# Patient Record
Sex: Female | Born: 1960 | Race: White | Hispanic: No | Marital: Married | Smoking: Never smoker
Health system: Southern US, Community
[De-identification: ages and names within clinical notes are randomized; demographics above are authoritative.]

## PROBLEM LIST (undated history)

## (undated) DIAGNOSIS — M1990 Unspecified osteoarthritis, unspecified site: Secondary | ICD-10-CM

## (undated) DIAGNOSIS — F32A Depression, unspecified: Secondary | ICD-10-CM

## (undated) DIAGNOSIS — N6002 Solitary cyst of left breast: Secondary | ICD-10-CM

## (undated) DIAGNOSIS — K219 Gastro-esophageal reflux disease without esophagitis: Secondary | ICD-10-CM

## (undated) DIAGNOSIS — E785 Hyperlipidemia, unspecified: Secondary | ICD-10-CM

## (undated) DIAGNOSIS — E119 Type 2 diabetes mellitus without complications: Secondary | ICD-10-CM

## (undated) DIAGNOSIS — F329 Major depressive disorder, single episode, unspecified: Secondary | ICD-10-CM

## (undated) DIAGNOSIS — Z8489 Family history of other specified conditions: Secondary | ICD-10-CM

## (undated) DIAGNOSIS — I10 Essential (primary) hypertension: Secondary | ICD-10-CM

## (undated) HISTORY — PX: ENDOMETRIAL BIOPSY: SHX622

## (undated) HISTORY — DX: Gastro-esophageal reflux disease without esophagitis: K21.9

## (undated) HISTORY — DX: Solitary cyst of left breast: N60.02

## (undated) HISTORY — DX: Essential (primary) hypertension: I10

## (undated) HISTORY — DX: Depression, unspecified: F32.A

## (undated) HISTORY — DX: Hyperlipidemia, unspecified: E78.5

## (undated) HISTORY — DX: Major depressive disorder, single episode, unspecified: F32.9

---
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
## 2006-03-03 ENCOUNTER — Other Ambulatory Visit: Payer: Self-pay

## 2006-03-04 ENCOUNTER — Ambulatory Visit: Payer: Self-pay | Admitting: Unknown Physician Specialty

## 2006-03-04 HISTORY — PX: HYSTEROSCOPY: SHX211

## 2008-09-21 ENCOUNTER — Ambulatory Visit: Payer: Self-pay | Admitting: Gastroenterology

## 2008-09-21 LAB — HM COLONOSCOPY: HM COLON: NORMAL

## 2010-10-02 ENCOUNTER — Ambulatory Visit: Payer: Self-pay | Admitting: Family Medicine

## 2011-02-17 ENCOUNTER — Ambulatory Visit: Payer: Self-pay | Admitting: Orthopedic Surgery

## 2011-08-08 ENCOUNTER — Emergency Department: Payer: Self-pay | Admitting: Emergency Medicine

## 2011-12-23 ENCOUNTER — Ambulatory Visit: Payer: Self-pay | Admitting: Family Medicine

## 2013-06-15 LAB — LIPID PANEL: LDL Cholesterol: 94 mg/dL

## 2014-06-15 LAB — CBC AND DIFFERENTIAL
HCT: 40 % (ref 36–46)
Hemoglobin: 13.9 g/dL (ref 12.0–16.0)
NEUTROS ABS: 71 /uL
Platelets: 251 10*3/uL (ref 150–399)
WBC: 8.6 10^3/mL

## 2014-06-15 LAB — HEPATIC FUNCTION PANEL
ALT: 28 U/L (ref 7–35)
AST: 25 U/L (ref 13–35)
Alkaline Phosphatase: 94 U/L (ref 25–125)
BILIRUBIN, TOTAL: 0.3 mg/dL

## 2014-06-15 LAB — BASIC METABOLIC PANEL
BUN: 16 mg/dL (ref 4–21)
CREATININE: 0.8 mg/dL (ref <=1.1)
GLUCOSE: 147 mg/dL
POTASSIUM: 3.9 mmol/L (ref 3.4–5.3)
SODIUM: 141 mmol/L (ref 137–147)

## 2014-06-15 LAB — LIPID PANEL
Cholesterol: 164 mg/dL (ref 0–200)
HDL: 36 mg/dL (ref 35–70)
LDl/HDL Ratio: 2.6
TRIGLYCERIDES: 171 mg/dL — AB (ref 40–160)

## 2014-06-15 LAB — HEMOGLOBIN A1C: HEMOGLOBIN A1C: 8 % — AB (ref 4.0–6.0)

## 2014-06-15 LAB — TSH: TSH: 2.66 u[IU]/mL (ref <=5.90)

## 2014-10-14 DIAGNOSIS — K219 Gastro-esophageal reflux disease without esophagitis: Secondary | ICD-10-CM | POA: Insufficient documentation

## 2014-10-14 DIAGNOSIS — H5702 Anisocoria: Secondary | ICD-10-CM | POA: Insufficient documentation

## 2014-10-14 DIAGNOSIS — I10 Essential (primary) hypertension: Secondary | ICD-10-CM | POA: Insufficient documentation

## 2014-10-14 DIAGNOSIS — Z8673 Personal history of transient ischemic attack (TIA), and cerebral infarction without residual deficits: Secondary | ICD-10-CM | POA: Insufficient documentation

## 2014-10-14 DIAGNOSIS — E1169 Type 2 diabetes mellitus with other specified complication: Secondary | ICD-10-CM | POA: Insufficient documentation

## 2014-10-14 DIAGNOSIS — F4320 Adjustment disorder, unspecified: Secondary | ICD-10-CM | POA: Insufficient documentation

## 2014-10-14 DIAGNOSIS — I152 Hypertension secondary to endocrine disorders: Secondary | ICD-10-CM | POA: Insufficient documentation

## 2014-10-14 DIAGNOSIS — E119 Type 2 diabetes mellitus without complications: Secondary | ICD-10-CM | POA: Insufficient documentation

## 2014-10-14 DIAGNOSIS — F320 Major depressive disorder, single episode, mild: Secondary | ICD-10-CM | POA: Insufficient documentation

## 2014-10-14 DIAGNOSIS — E7800 Pure hypercholesterolemia, unspecified: Secondary | ICD-10-CM | POA: Insufficient documentation

## 2014-11-02 ENCOUNTER — Ambulatory Visit (INDEPENDENT_AMBULATORY_CARE_PROVIDER_SITE_OTHER): Payer: 59 | Admitting: Family Medicine

## 2014-11-02 ENCOUNTER — Encounter: Payer: Self-pay | Admitting: Family Medicine

## 2014-11-02 VITALS — BP 100/72 | HR 74 | Temp 98.4°F | Resp 12 | Wt 157.0 lb

## 2014-11-02 DIAGNOSIS — E7800 Pure hypercholesterolemia, unspecified: Secondary | ICD-10-CM

## 2014-11-02 DIAGNOSIS — J3089 Other allergic rhinitis: Secondary | ICD-10-CM | POA: Diagnosis not present

## 2014-11-02 DIAGNOSIS — F320 Major depressive disorder, single episode, mild: Secondary | ICD-10-CM

## 2014-11-02 DIAGNOSIS — I10 Essential (primary) hypertension: Secondary | ICD-10-CM

## 2014-11-02 DIAGNOSIS — E119 Type 2 diabetes mellitus without complications: Secondary | ICD-10-CM | POA: Diagnosis not present

## 2014-11-02 LAB — POCT GLYCOSYLATED HEMOGLOBIN (HGB A1C): Hemoglobin A1C: 7.7

## 2014-11-02 LAB — HM DIABETES FOOT EXAM: HM Diabetic Foot Exam: NORMAL

## 2014-11-02 MED ORDER — ATORVASTATIN CALCIUM 40 MG PO TABS: 40.0000 mg | ORAL_TABLET | Freq: Every day | ORAL | Status: DC

## 2014-11-02 MED ORDER — FLUTICASONE PROPIONATE 50 MCG/ACT NA SUSP: 2.0000 | Freq: Every day | NASAL | Status: DC

## 2014-11-02 MED ORDER — HYDROCHLOROTHIAZIDE 25 MG PO TABS: 25.0000 mg | ORAL_TABLET | Freq: Every day | ORAL | Status: DC

## 2014-11-02 MED ORDER — METFORMIN HCL 1000 MG PO TABS: 1000.0000 mg | ORAL_TABLET | Freq: Every day | ORAL | Status: DC

## 2014-11-02 MED ORDER — SERTRALINE HCL 100 MG PO TABS: 100.0000 mg | ORAL_TABLET | Freq: Every day | ORAL | Status: DC

## 2014-11-02 NOTE — Progress Notes (Signed)
Patient ID: Andrea Soto, female   DOB: 11-Jan-1961, 54 y.o.   MRN: [id number]   Patient: Andrea Soto Female    DOB: August 21, 1960   54 y.o.   MRN: [id number] Visit Date: 11/02/2014  Today's Provider: [name], MD   Chief Complaint  Patient presents with  . Diabetes  . Depression  . Hyperlipidemia   Subjective:    HPI   Overall she feels well and has no complaints.   Diabetes Mellitus Type II, Follow-up:   Lab Results  Component Value Date   HGBA1C 7.7 11/02/2014   HGBA1C 8.0* 06/15/2014    Last seen for diabetes 4 months ago.  Management changes included none. She reports good compliance with treatment. She is not having side effects.  Current symptoms include none and have been unchanged. Home blood sugar records: she is not checking sugar levels at home  Episodes of hypoglycemia? no   Current Insulin Regimen: none Most Recent Eye Exam: 05/2014. Weight trend: stable Prior visit with dietician: yes - with her husband 2 years ago per patient. Current diet: on average, 3 meals per day Current exercise: none  Pertinent Labs:    Component Value Date/Time   CHOL 164 06/15/2014   TRIG 171* 06/15/2014   CREATININE 0.8 06/15/2014    Wt Readings from Last 3 Encounters:  11/02/14 157 lb (71.215 kg)  05/24/14 155 lb (70.308 kg)    ------------------------------------------------------------------------  Depression, Follow-up  She  was last seen for this 4 months ago. Changes made at last visit include none.   She reports good compliance with treatment. She is not having side effects.   She reportsgood compliance with treatment. She reports good tolerance of treatment. Current symptoms include: doing well on Sertraline. She feels she is Unchanged since last visit.  ------------------------------------------------------------------------   Lipid/Cholesterol, Follow-up:   Last seen for this4 months ago.  Management changes since that visit include  ordered levels. . Last Lipid Panel:    Component Value Date/Time   CHOL 164 06/15/2014   TRIG 171* 06/15/2014   HDL 36 06/15/2014   LDLCALC 94 06/15/2013    She reports good compliance with treatment. She is not having side effects.  Current symptoms include none and have been resolved. Weight trend: stable Prior visit with dietician: yes - 2 years ago Current diet: on average, 3 meals per day Current exercise: none  Wt Readings from Last 3 Encounters:  11/02/14 157 lb (71.215 kg)  05/24/14 155 lb (70.308 kg)    -------------------------------------------------------------------       Previous Medications   ASPIRIN 81 MG TABLET    Take by mouth.   ATORVASTATIN (LIPITOR) 40 MG TABLET    Take by mouth.   COENZYME Q10 (COQ10) 200 MG CAPS    Take by mouth.   HYDROCHLOROTHIAZIDE (HYDRODIURIL) 25 MG TABLET    Take by mouth.   MELOXICAM (MOBIC) 15 MG TABLET    Take by mouth.   METFORMIN (GLUCOPHAGE) 1000 MG TABLET    Take by mouth.   OMEPRAZOLE (PRILOSEC OTC) 20 MG TABLET    Take by mouth.   SERTRALINE (ZOLOFT) 100 MG TABLET    Take by mouth.    Review of Systems  Constitutional: Positive for fatigue.  HENT: Positive for congestion (in right ear- x 1 week ago).   Respiratory: Negative.   Cardiovascular: Negative.   Musculoskeletal: Negative.     History  Substance Use Topics  . Smoking status: Never Smoker   . Smokeless tobacco: Never  Used  . Alcohol Use: 0.6 oz/week    1 Glasses of wine per week   Objective:   BP 100/72 mmHg  Pulse 74  Temp(Src) 98.4 F (36.9 C)  Resp 12  Wt 157 lb (71.215 kg)  Physical Exam  Constitutional: She appears well-developed and well-nourished.  HENT:  Head: Normocephalic.  Right Ear: External ear normal. No lacerations. No swelling or tenderness.  Left Ear: External ear normal.  Nose: Nose normal.  Mouth/Throat: Oropharynx is clear and moist.  Eyes: Conjunctivae are normal. Pupils are equal, round, and reactive to light.   Neck: Normal range of motion.  Cardiovascular: Normal rate, regular rhythm, normal heart sounds and intact distal pulses.   No murmur heard. Pulmonary/Chest: Effort normal and breath sounds normal. She has no wheezes. She has no rales.  Musculoskeletal: Normal range of motion.  Neurological: She is alert. No sensory deficit.  Normal monofilament exam today 11/02/14  Skin: Skin is warm.  Psychiatric: She has a normal mood and affect. Her behavior is normal.         1. Essential hypertension Stable.  2. Type 2 diabetes mellitus without complication L5Q today 7.7. Better. Continue working on habits. Follow for now.  - POCT HgB A1C - HM Diabetes Foot Exam-normal today.  3. Depression, major, single episode, mild Stable. Follow.  4. Other allergic rhinitis Try Flonase. Follow as needed. - fluticasone (FLONASE) 50 MCG/ACT nasal spray; Place 2 sprays into both nostrils daily.  Dispense: 16 g; Refill: 6  5. Hypercholesteremia Stable on last visit.   Follow up 4 month.  Patient was seen and examined by Dr. Eulas Post and note was scribed by Theressa Millard, RMA.  I have done the exam and reviewed the above chart and it is accurate to the best of my knowledge.

## 2014-11-13 ENCOUNTER — Emergency Department: Payer: 59

## 2014-11-13 ENCOUNTER — Emergency Department
Admission: EM | Admit: 2014-11-13 | Discharge: 2014-11-13 | Disposition: A | Discharge status: Home / Self Care | Payer: 59 | Attending: Emergency Medicine | Admitting: Emergency Medicine

## 2014-11-13 ENCOUNTER — Encounter: Payer: Self-pay | Admitting: Family Medicine

## 2014-11-13 ENCOUNTER — Ambulatory Visit (INDEPENDENT_AMBULATORY_CARE_PROVIDER_SITE_OTHER): Payer: 59 | Admitting: Family Medicine

## 2014-11-13 ENCOUNTER — Encounter: Payer: Self-pay | Admitting: Emergency Medicine

## 2014-11-13 VITALS — BP 136/90 | HR 103 | Temp 98.2°F | Resp 18 | Wt 156.0 lb

## 2014-11-13 DIAGNOSIS — W57XXXA Bitten or stung by nonvenomous insect and other nonvenomous arthropods, initial encounter: Secondary | ICD-10-CM

## 2014-11-13 DIAGNOSIS — R52 Pain, unspecified: Secondary | ICD-10-CM | POA: Insufficient documentation

## 2014-11-13 DIAGNOSIS — K529 Noninfective gastroenteritis and colitis, unspecified: Secondary | ICD-10-CM | POA: Diagnosis not present

## 2014-11-13 DIAGNOSIS — E860 Dehydration: Secondary | ICD-10-CM

## 2014-11-13 DIAGNOSIS — R197 Diarrhea, unspecified: Secondary | ICD-10-CM | POA: Insufficient documentation

## 2014-11-13 DIAGNOSIS — Z7952 Long term (current) use of systemic steroids: Secondary | ICD-10-CM | POA: Diagnosis not present

## 2014-11-13 DIAGNOSIS — E119 Type 2 diabetes mellitus without complications: Secondary | ICD-10-CM | POA: Diagnosis not present

## 2014-11-13 DIAGNOSIS — Z7982 Long term (current) use of aspirin: Secondary | ICD-10-CM | POA: Diagnosis not present

## 2014-11-13 DIAGNOSIS — S90862A Insect bite (nonvenomous), left foot, initial encounter: Secondary | ICD-10-CM | POA: Diagnosis not present

## 2014-11-13 DIAGNOSIS — Z791 Long term (current) use of non-steroidal anti-inflammatories (NSAID): Secondary | ICD-10-CM | POA: Diagnosis not present

## 2014-11-13 DIAGNOSIS — R112 Nausea with vomiting, unspecified: Secondary | ICD-10-CM | POA: Insufficient documentation

## 2014-11-13 DIAGNOSIS — Z792 Long term (current) use of antibiotics: Secondary | ICD-10-CM | POA: Insufficient documentation

## 2014-11-13 DIAGNOSIS — Z79899 Other long term (current) drug therapy: Secondary | ICD-10-CM | POA: Diagnosis not present

## 2014-11-13 DIAGNOSIS — N3090 Cystitis, unspecified without hematuria: Secondary | ICD-10-CM

## 2014-11-13 LAB — CBC WITH DIFFERENTIAL/PLATELET
BASOS PCT: 0 %
Basophils Absolute: 0 10*3/uL (ref 0–0.1)
Eosinophils Absolute: 0 10*3/uL (ref 0–0.7)
Eosinophils Relative: 0 %
HEMATOCRIT: 41.6 % (ref 35.0–47.0)
Hemoglobin: 13.8 g/dL (ref 12.0–16.0)
Lymphocytes Relative: 10 %
Lymphs Abs: 0.7 10*3/uL — ABNORMAL LOW (ref 1.0–3.6)
MCH: 27.5 pg (ref 26.0–34.0)
MCHC: 33.2 g/dL (ref 32.0–36.0)
MCV: 82.9 fL (ref 80.0–100.0)
Monocytes Absolute: 0.6 10*3/uL (ref 0.2–0.9)
Monocytes Relative: 8 %
Neutro Abs: 5.9 10*3/uL (ref 1.4–6.5)
Neutrophils Relative %: 82 %
Platelets: 188 10*3/uL (ref 150–440)
RBC: 5.02 MIL/uL (ref 3.80–5.20)
RDW: 13.7 % (ref 11.5–14.5)
WBC: 7.2 10*3/uL (ref 3.6–11.0)

## 2014-11-13 LAB — BASIC METABOLIC PANEL
Anion gap: 8 (ref 5–15)
BUN: 17 mg/dL (ref 6–20)
CO2: 27 mmol/L (ref 22–32)
Calcium: 8.2 mg/dL — ABNORMAL LOW (ref 8.9–10.3)
Chloride: 101 mmol/L (ref 101–111)
Creatinine, Ser: 0.64 mg/dL (ref 0.44–1.00)
GFR calc Af Amer: >60 mL/min (ref >60)
Glucose, Bld: 175 mg/dL — ABNORMAL HIGH (ref 65–99)
POTASSIUM: 3 mmol/L — AB (ref 3.5–5.1)
Sodium: 136 mmol/L (ref 135–145)

## 2014-11-13 LAB — HEPATIC FUNCTION PANEL
ALT: 33 U/L (ref 14–54)
AST: 27 U/L (ref 15–41)
Albumin: 3.9 g/dL (ref 3.5–5.0)
Alkaline Phosphatase: 77 U/L (ref 38–126)
BILIRUBIN INDIRECT: 0.5 mg/dL (ref 0.3–0.9)
Bilirubin, Direct: 0.1 mg/dL (ref 0.1–0.5)
TOTAL PROTEIN: 6.9 g/dL (ref 6.5–8.1)
Total Bilirubin: 0.6 mg/dL (ref 0.3–1.2)

## 2014-11-13 LAB — URINALYSIS COMPLETE WITH MICROSCOPIC (ARMC ONLY)
Bilirubin Urine: NEGATIVE
Glucose, UA: NEGATIVE mg/dL
Hgb urine dipstick: NEGATIVE
KETONES UR: NEGATIVE mg/dL
Nitrite: POSITIVE — AB
PH: 5 (ref 5.0–8.0)
PROTEIN: 30 mg/dL — AB
SPECIFIC GRAVITY, URINE: 1.026 (ref 1.005–1.030)

## 2014-11-13 LAB — LIPASE, BLOOD: LIPASE: 24 U/L (ref 22–51)

## 2014-11-13 LAB — CK: Total CK: 65 U/L (ref 38–234)

## 2014-11-13 LAB — SEDIMENTATION RATE: Sed Rate: 17 mm/hr (ref 0–30)

## 2014-11-13 MED ORDER — MORPHINE SULFATE 4 MG/ML IJ SOLN
INTRAMUSCULAR | Status: AC
  Administered 2014-11-13: 4 mg via INTRAVENOUS
  Filled 2014-11-13: qty 1

## 2014-11-13 MED ORDER — MORPHINE SULFATE 4 MG/ML IJ SOLN
4.0000 mg | Freq: Once | INTRAMUSCULAR | Status: AC
  Administered 2014-11-13: 4 mg via INTRAVENOUS

## 2014-11-13 MED ORDER — CEFTRIAXONE SODIUM IN DEXTROSE 20 MG/ML IV SOLN
INTRAVENOUS | Status: AC
  Administered 2014-11-13: 1000 mg via INTRAVENOUS
  Filled 2014-11-13: qty 50

## 2014-11-13 MED ORDER — DOXYCYCLINE HYCLATE 100 MG IV SOLR
100.0000 mg | Freq: Once | INTRAVENOUS | Status: AC
  Administered 2014-11-13: 100 mg via INTRAVENOUS
  Filled 2014-11-13: qty 100

## 2014-11-13 MED ORDER — ONDANSETRON HCL 4 MG/2ML IJ SOLN
INTRAMUSCULAR | Status: AC
  Administered 2014-11-13: 4 mg via INTRAVENOUS
  Filled 2014-11-13: qty 2

## 2014-11-13 MED ORDER — CEFTRIAXONE SODIUM IN DEXTROSE 20 MG/ML IV SOLN
1.0000 g | Freq: Once | INTRAVENOUS | Status: AC
  Administered 2014-11-13: 1000 mg via INTRAVENOUS

## 2014-11-13 MED ORDER — KETOROLAC TROMETHAMINE 30 MG/ML IJ SOLN
INTRAMUSCULAR | Status: AC
  Administered 2014-11-13: 30 mg via INTRAVENOUS
  Filled 2014-11-13: qty 1

## 2014-11-13 MED ORDER — KETOROLAC TROMETHAMINE 30 MG/ML IJ SOLN
30.0000 mg | Freq: Once | INTRAMUSCULAR | Status: AC
  Administered 2014-11-13: 30 mg via INTRAVENOUS

## 2014-11-13 MED ORDER — ONDANSETRON HCL 4 MG PO TABS: 4.0000 mg | ORAL_TABLET | Freq: Four times a day (QID) | ORAL | Status: DC | PRN

## 2014-11-13 MED ORDER — DOXYCYCLINE HYCLATE 100 MG PO CAPS
100.0000 mg | ORAL_CAPSULE | Freq: Two times a day (BID) | ORAL | Status: DC
Start: 2014-11-13 — End: 2014-12-14

## 2014-11-13 MED ORDER — SULFAMETHOXAZOLE-TRIMETHOPRIM 800-160 MG PO TABS: 1.0000 | ORAL_TABLET | Freq: Two times a day (BID) | ORAL | Status: DC

## 2014-11-13 MED ORDER — POTASSIUM CHLORIDE 20 MEQ PO PACK
PACK | ORAL | Status: AC
  Administered 2014-11-13: 40 meq via ORAL
  Filled 2014-11-13: qty 2

## 2014-11-13 MED ORDER — ONDANSETRON HCL 4 MG/2ML IJ SOLN
4.0000 mg | Freq: Once | INTRAMUSCULAR | Status: AC
  Administered 2014-11-13: 4 mg via INTRAVENOUS

## 2014-11-13 MED ORDER — SODIUM CHLORIDE 0.9 % IV BOLUS (SEPSIS)
1000.0000 mL | Freq: Once | INTRAVENOUS | Status: AC
  Administered 2014-11-13: 1000 mL via INTRAVENOUS

## 2014-11-13 MED ORDER — SODIUM CHLORIDE 0.9 % IV BOLUS (SEPSIS): 1000.0000 mL | Freq: Once | INTRAVENOUS | Status: AC

## 2014-11-13 MED ORDER — POTASSIUM CHLORIDE 20 MEQ PO PACK
40.0000 meq | PACK | Freq: Once | ORAL | Status: AC
  Administered 2014-11-13: 40 meq via ORAL

## 2014-11-13 NOTE — ED Notes (Signed)
Pt up to bedside commode.  Unable to obtain sample.

## 2014-11-13 NOTE — ED Notes (Signed)
Reports pulling a tick off about 8 days ago.  3 days ago started having fever, n/v/d. Skin w/d at this time.  Moist mm.

## 2014-11-13 NOTE — ED Notes (Signed)
Pt resting, receiving IV fluids and antibiotic.

## 2014-11-13 NOTE — Discharge Instructions (Signed)
Dehydration, Adult Dehydration is when you lose more fluids from the body than you take in. Vital organs like the kidneys, brain, and heart cannot function without a proper amount of fluids and salt. Any loss of fluids from the body can cause dehydration.  CAUSES   Vomiting.  Diarrhea.  Excessive sweating.  Excessive urine output.  Fever. SYMPTOMS  Mild dehydration  Thirst.  Dry lips.  Slightly dry mouth. Moderate dehydration  Very dry mouth.  Sunken eyes.  Skin does not bounce back quickly when lightly pinched and released.  Dark urine and decreased urine production.  Decreased tear production.  Headache. Severe dehydration  Very dry mouth.  Extreme thirst.  Rapid, weak pulse (more than 100 beats per minute at rest).  Cold hands and feet.  Not able to sweat in spite of heat and temperature.  Rapid breathing.  Blue lips.  Confusion and lethargy.  Difficulty being awakened.  Minimal urine production.  No tears. DIAGNOSIS  Your caregiver will diagnose dehydration based on your symptoms and your exam. Blood and urine tests will help confirm the diagnosis. The diagnostic evaluation should also identify the cause of dehydration. TREATMENT  Treatment of mild or moderate dehydration can often be done at home by increasing the amount of fluids that you drink. It is best to drink small amounts of fluid more often. Drinking too much at one time can make vomiting worse. Refer to the home care instructions below. Severe dehydration needs to be treated at the hospital where you will probably be given intravenous (IV) fluids that contain water and electrolytes. HOME CARE INSTRUCTIONS   Ask your caregiver about specific rehydration instructions.  Drink enough fluids to keep your urine clear or pale yellow.  Drink small amounts frequently if you have nausea and vomiting.  Eat as you normally do.  Avoid:  Foods or drinks high in sugar.  Carbonated  drinks.  Juice.  Extremely hot or cold fluids.  Drinks with caffeine.  Fatty, greasy foods.  Alcohol.  Tobacco.  Overeating.  Gelatin desserts.  Wash your hands well to avoid spreading bacteria and viruses.  Only take over-the-counter or prescription medicines for pain, discomfort, or fever as directed by your caregiver.  Ask your caregiver if you should continue all prescribed and over-the-counter medicines.  Keep all follow-up appointments with your caregiver. SEEK MEDICAL CARE IF:  You have abdominal pain and it increases or stays in one area (localizes).  You have a rash, stiff neck, or severe headache.  You are irritable, sleepy, or difficult to awaken.  You are weak, dizzy, or extremely thirsty. SEEK IMMEDIATE MEDICAL CARE IF:   You are unable to keep fluids down or you get worse despite treatment.  You have frequent episodes of vomiting or diarrhea.  You have blood or green matter (bile) in your vomit.  You have blood in your stool or your stool looks black and tarry.  You have not urinated in 6 to 8 hours, or you have only urinated a small amount of very dark urine.  You have a fever.  You faint. MAKE SURE YOU:   Understand these instructions.  Will watch your condition.  Will get help right away if you are not doing well or get worse. Document Released: 05/12/2005 Document Revised: 08/04/2011 Document Reviewed: 12/30/2010 Exit[employer] Patient Information 2015 [employer]. This information is not intended to replace advice given to you by your health care provider. Make sure you discuss any questions you have with your health care   provider.  

## 2014-11-13 NOTE — ED Notes (Signed)
Pt was at Dr. Jabier Gauss office with c/o N/V/D for the last three days.

## 2014-11-13 NOTE — ED Provider Notes (Signed)
[facility] Emergency Department Provider Note  ____________________________________________  Time seen: 12:10 PM  I have reviewed the triage vital signs and the nursing notes.   HISTORY  Chief Complaint Dehydration    HPI Andrea Soto is a 54 y.o. female who complains of nausea vomiting diarrhea and generalized body aches for the past 3 days. She found a tick on her left foot 8 days ago. She is unsure how long it was on there but thinks it was less than a day. 3 days ago, she started having the nausea vomiting diarrhea, generalized body aches and joint pains, and subjective fever. Temperature was 99 at home. She has not noted any rashes on her skin. 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 No pertinent past medical history.  Patient Active Problem List   Diagnosis Date Noted  . 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 Depression, major, single episode, mild 10/14/2014  . Acid reflux 10/14/2014  . H/O transient cerebral ischemia 10/14/2014  . Hypercholesteremia 10/14/2014  . BP (high blood pressure) 10/14/2014  . 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 Diabetes mellitus, type 2 10/14/2014    Past Surgical History  Procedure Laterality Date  . Endometrial biopsy    . Breast biopsy  1978    Current Outpatient Rx  Name  Route  Sig  Dispense  Refill  . aspirin 81 MG tablet   Oral   Take by mouth.         Marland Kitchen atorvastatin (LIPITOR) 40 MG tablet   Oral   Take 1 tablet (40 mg total) by mouth at bedtime.   90 tablet   3   . Coenzyme Q10 (COQ10) 200 MG CAPS   Oral   Take by mouth.         . doxycycline (VIBRAMYCIN) 100 MG capsule   Oral   Take 1 capsule (100 mg total) by mouth 2 (two) times daily.   28 capsule   0   . fluticasone (FLONASE) 50 MCG/ACT nasal spray   Each Nare   Place 2 sprays into both nostrils daily.   16 g   6   . hydrochlorothiazide  (HYDRODIURIL) 25 MG tablet   Oral   Take 1 tablet (25 mg total) by mouth daily.   90 tablet   3   . meloxicam (MOBIC) 15 MG tablet   Oral   Take by mouth.         . metFORMIN (GLUCOPHAGE) 1000 MG tablet   Oral   Take 1 tablet (1,000 mg total) by mouth daily with breakfast.   90 tablet   3   . omeprazole (PRILOSEC OTC) 20 MG tablet   Oral   Take by mouth.         . ondansetron (ZOFRAN) 4 MG tablet   Oral   Take 1 tablet (4 mg total) by mouth every 6 (six) hours as needed for nausea or vomiting.   20 tablet   1   . sertraline (ZOLOFT) 100 MG tablet   Oral   Take 1 tablet (100 mg total) by mouth daily.   90 tablet   3     Allergies Review of patient's allergies indicates no known allergies.  Family History  Problem Relation Age of Onset  . Hypertension Mother   . Allergic rhinitis Mother   . Cancer Father     multiple myeloma and  bladder cancer.  Marland Kitchen Heart attack Father   . Multiple myeloma Father   . Hypertension Sister   . Epilepsy Daughter   . Seizures Daughter   . Allergic rhinitis Son   . Breast cancer Paternal Grandmother   . Bone cancer Paternal Grandmother     Social History History  Substance Use Topics  . Smoking status: Never Smoker   . Smokeless tobacco: Never Used  . Alcohol Use: 0.6 oz/week    1 Glasses of wine per week    Review of Systems  Constitutional: Positive fever and chills. No weight changes. Profound fatigue Eyes:No blurry vision or double vision.  ENT: No sore throat. Cardiovascular: No chest pain. Respiratory: No dyspnea or cough. Gastrointestinal: Generalized abdominal pain with vomiting and diarrhea.  No BRBPR or melena. Genitourinary: Negative for dysuria, urinary retention, bloody urine, or difficulty urinating. Musculoskeletal: Generalized muscle and joint pain. Skin: Negative for rash. Neurological: Negative for headaches, focal weakness or numbness. Psychiatric:No anxiety or depression.   Endocrine:No  hot/cold intolerance, changes in energy, or sleep difficulty.  10-point ROS otherwise negative.  ____________________________________________   PHYSICAL EXAM:  VITAL SIGNS: ED Triage Vitals  Enc Vitals Group     BP 11/13/14 1206 123/79 mmHg     Pulse Rate 11/13/14 1206 105     Resp 11/13/14 1206 20     Temp 11/13/14 1206 99.3 F (37.4 C)     Temp Source 11/13/14 1206 Oral     SpO2 11/13/14 1206 96 %     Weight 11/13/14 1206 155 lb (70.308 kg)     Height 11/13/14 1206 '5\' 2"'  (1.575 m)     Head Cir --      Peak Flow --      Pain Score --      Pain Loc --      Pain Edu? --      Excl. in [place]? --      Constitutional: Alert and oriented. Generally ill appearing. Eyes: No scleral icterus. No conjunctival pallor. PERRL. EOMI ENT   Head: Normocephalic and atraumatic.   Nose: No congestion/rhinnorhea. No septal hematoma   Mouth/Throat: Dry mucous membranes, no pharyngeal erythema. No peritonsillar mass. No uvula shift.   Neck: No stridor. No SubQ emphysema. No meningismus. Hematological/Lymphatic/Immunilogical: No cervical lymphadenopathy. Cardiovascular: Tachycardia to 105. Normal and symmetric distal pulses are present in all extremities. No murmurs, rubs, or gallops. Respiratory: Normal respiratory effort without tachypnea nor retractions. Breath sounds are clear and equal bilaterally. No wheezes/rales/rhonchi. Gastrointestinal: Generalized tenderness, nonfocal, soft. No distention. There is no CVA tenderness.  No rebound, rigidity, or guarding. Genitourinary: deferred Musculoskeletal: Nontender with normal range of motion in all extremities. No joint effusions.  No lower extremity tenderness.  No edema. Neurologic:   Normal speech and language.  CN 2-10 normal. Motor grossly intact. No pronator drift.  Normal gait. No gross focal neurologic deficits are appreciated.  Skin:  Skin is warm, dry and intact. No rash noted.  No petechiae, purpura, or bullae. 1-2 mm  umbilicated lesion on the left medial foot where the tick was removed. There does not appear to be any retained tick fragment. There is no target lesion or pattern to rash on the leg or anywhere else on the body Psychiatric: Mood and affect are normal. Speech and behavior are normal. Patient exhibits appropriate insight and judgment.  ____________________________________________    LABS (pertinent positives/negatives) (all labs ordered are listed, but only abnormal results are displayed) Labs Reviewed  BASIC METABOLIC PANEL -  Abnormal; Notable for the following:    Potassium 3.0 (*)    Glucose, Bld 175 (*)    Calcium 8.2 (*)    All other components within normal limits  CBC WITH DIFFERENTIAL/PLATELET - Abnormal; Notable for the following:    Lymphs Abs 0.7 (*)    All other components within normal limits  HEPATIC FUNCTION PANEL  LIPASE, BLOOD  CK  SEDIMENTATION RATE  URINALYSIS COMPLETEWITH MICROSCOPIC ([facility] ONLY)   ____________________________________________   EKG    ____________________________________________    RADIOLOGY  Chest x-ray unremarkable  ____________________________________________   PROCEDURES  ____________________________________________   INITIAL IMPRESSION / ASSESSMENT AND PLAN / ED COURSE  Pertinent labs & imaging results that were available during my care of the patient were reviewed by me and considered in my medical decision making (see chart for details).  Patient presents with mild elevation of temperature and heart rate in the setting of a tick exposure. This is possibly consistent with a tick borne illness especially since she has contracted such an illness in her area in the past. We'll give her IV fluids, symptom relief with Toradol and Zofran, and start doxycycline pending workup. ----------------------------------------- 3:16 PM on 11/13/2014 -----------------------------------------  Labs unremarkable except for mild hypokalemia  at 3.0. Patient will be given oral potassium repletion. She is feeling much better after 1 L IV fluids and her vital signs have normalized. We will proceed with oral challenge, continue rehydrating until she is able to produce urine. We'll make sure she doesn't have a urinary tract infection, and plan on discharging her home on doxycycline for tick exposure. Care of the patient is signed out to the oncoming physician Dr. Jimmye Norman will follow up on response to IV fluids and urinalysis. ____________________________________________   FINAL CLINICAL IMPRESSION(S) / ED DIAGNOSES  Final diagnoses:  Nausea vomiting and diarrhea  Dehydration      Carrie Mew, MD 11/13/14 1517

## 2014-11-13 NOTE — Progress Notes (Signed)
Subjective:    Patient ID: Andrea Soto, female    DOB: Feb 06, 1961, 54 y.o.   MRN: [id number]  HPI Diarrhea and Emesis with body aches since Saturday 11-11-14 in the afternoon. Started with diarrhea and stomach cramps before nausea and vomiting started. Having watery stools every 2-3 hours now. Last episode of vomiting was Sunday morning 11-12-14. Ate some dry toast without vomiting in the afternoon. 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 Had a tick bite to the left foot 8-10 days ago.   Patient Active Problem List   Diagnosis Date Noted  . 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 Depression, major, single episode, mild 10/14/2014  . Acid reflux 10/14/2014  . H/O transient cerebral ischemia 10/14/2014  . Hypercholesteremia 10/14/2014  . BP (high blood pressure) 10/14/2014  . 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 Diabetes mellitus, type 2 10/14/2014   Past Surgical History  Procedure Laterality Date  . Endometrial biopsy    . Breast biopsy  1978   History  Substance Use Topics  . Smoking status: Never Smoker   . Smokeless tobacco: Never Used  . Alcohol Use: 0.6 oz/week    1 Glasses of wine per week   Family History  Problem Relation Age of Onset  . Hypertension Mother   . Allergic rhinitis Mother   . Cancer Father     multiple myeloma and bladder cancer.  Marland Kitchen Heart attack Father   . Multiple myeloma Father   . Hypertension Sister   . Epilepsy Daughter   . Seizures Daughter   . Allergic rhinitis Son   . Breast cancer Paternal Grandmother   . Bone cancer Paternal Grandmother    Current Outpatient Prescriptions on File Prior to Visit  Medication Sig Dispense Refill  . aspirin 81 MG tablet Take by mouth.    Marland Kitchen atorvastatin (LIPITOR) 40 MG tablet Take 1 tablet (40 mg total) by mouth at bedtime. 90 tablet 3  . Coenzyme Q10 (COQ10) 200 MG CAPS Take by mouth.    . fluticasone (FLONASE) 50 MCG/ACT nasal spray Place 2 sprays into both  nostrils daily. 16 g 6  . hydrochlorothiazide (HYDRODIURIL) 25 MG tablet Take 1 tablet (25 mg total) by mouth daily. 90 tablet 3  . meloxicam (MOBIC) 15 MG tablet Take by mouth.    . metFORMIN (GLUCOPHAGE) 1000 MG tablet Take 1 tablet (1,000 mg total) by mouth daily with breakfast. 90 tablet 3  . omeprazole (PRILOSEC OTC) 20 MG tablet Take by mouth.    . sertraline (ZOLOFT) 100 MG tablet Take 1 tablet (100 mg total) by mouth daily. 90 tablet 3   No current facility-administered medications on file prior to visit.   No Known Allergies  Review of Systems  Constitutional: Positive for appetite change and fatigue. Negative for fever and chills.  Respiratory: Negative.   Cardiovascular: Negative.   Gastrointestinal: Positive for nausea, vomiting, abdominal pain and diarrhea.  Musculoskeletal:       Body aches and soreness in muscles.  Neurological: Positive for dizziness and headaches.      BP 136/90 mmHg  Pulse 103  Temp(Src) 98.2 F (36.8 C) (Oral)  Resp 18  Wt 156 lb (70.761 kg)  SpO2 93%  Objective:   Physical Exam  Constitutional: She is oriented to person, place, and time. She appears well-developed and well-nourished. She appears distressed.  Pale, lethargic, dizzy and weak.  HENT:  Head: Normocephalic and atraumatic.  Right Ear: External ear normal.  Left Ear: External ear normal.  Nose: Nose normal.  Eyes: EOM are normal. Pupils are equal, round, and reactive to light.  Neck: Normal range of motion. Neck supple.  Cardiovascular: Regular rhythm and normal heart sounds.  Tachycardia present.   Abdominal: There is no hepatosplenomegaly. There is generalized tenderness. There is no rigidity and no rebound.    Bowel sounds slightly hyperactive.  Lymphadenopathy:       Head (right side): No submandibular adenopathy present.       Head (left side): No submandibular adenopathy present.       Right axillary: No lateral adenopathy present.       Left axillary: No lateral  adenopathy present.      Right: No inguinal adenopathy present.       Left: No inguinal adenopathy present.  No enlargement of nodes.  Neurological: She is alert and oriented to person, place, and time.  Skin: There is pallor.      Assessment & Plan:  1. Gastroenteritis Onset of diarrhea, nausea and vomiting the past 2 days. Watery diarrhea and unable to retain food or much liquids. Generalized abdominal cramping/tenderness. Sent to Er for IV rehydration. Unable to keep medications down.  2. Tick bite of foot, left, initial encounter New bite to the left foot 8-10 days ago. History of Ehrlichiosis infection in 2013. States the general malaise and weakness with sore muscles feels the same this time.  3. Dehydration Dizzy and weak with lethargy. Pale and not able to keep much down without vomiting or watery diarrhea ever 2-3 hours. No help with OTC Imodium-AD. Sent to ER for IV hydration.

## 2014-11-14 ENCOUNTER — Encounter: Payer: Self-pay | Admitting: Emergency Medicine

## 2014-11-14 ENCOUNTER — Emergency Department
Admission: EM | Admit: 2014-11-14 | Discharge: 2014-11-14 | Disposition: A | Discharge status: Home / Self Care | Payer: 59 | Attending: Emergency Medicine | Admitting: Emergency Medicine

## 2014-11-14 DIAGNOSIS — E792 Myoadenylate deaminase deficiency: Secondary | ICD-10-CM | POA: Diagnosis not present

## 2014-11-14 DIAGNOSIS — Z791 Long term (current) use of non-steroidal anti-inflammatories (NSAID): Secondary | ICD-10-CM | POA: Insufficient documentation

## 2014-11-14 DIAGNOSIS — Z7951 Long term (current) use of inhaled steroids: Secondary | ICD-10-CM | POA: Insufficient documentation

## 2014-11-14 DIAGNOSIS — R5381 Other malaise: Secondary | ICD-10-CM

## 2014-11-14 DIAGNOSIS — R509 Fever, unspecified: Secondary | ICD-10-CM | POA: Diagnosis present

## 2014-11-14 DIAGNOSIS — R5383 Other fatigue: Secondary | ICD-10-CM

## 2014-11-14 DIAGNOSIS — Z79899 Other long term (current) drug therapy: Secondary | ICD-10-CM | POA: Insufficient documentation

## 2014-11-14 DIAGNOSIS — Z7982 Long term (current) use of aspirin: Secondary | ICD-10-CM | POA: Diagnosis not present

## 2014-11-14 DIAGNOSIS — E119 Type 2 diabetes mellitus without complications: Secondary | ICD-10-CM | POA: Insufficient documentation

## 2014-11-14 DIAGNOSIS — N3000 Acute cystitis without hematuria: Secondary | ICD-10-CM

## 2014-11-14 DIAGNOSIS — R197 Diarrhea, unspecified: Secondary | ICD-10-CM | POA: Insufficient documentation

## 2014-11-14 MED ORDER — FAMOTIDINE 20 MG PO TABS
ORAL_TABLET | ORAL | Status: AC
  Filled 2014-11-14: qty 2

## 2014-11-14 MED ORDER — DEXAMETHASONE SODIUM PHOSPHATE 10 MG/ML IJ SOLN
INTRAMUSCULAR | Status: AC
  Filled 2014-11-14: qty 1

## 2014-11-14 MED ORDER — LOPERAMIDE HCL 2 MG PO CAPS
ORAL_CAPSULE | ORAL | Status: AC
  Filled 2014-11-14: qty 2

## 2014-11-14 MED ORDER — DICYCLOMINE HCL 20 MG PO TABS: 20.0000 mg | ORAL_TABLET | Freq: Three times a day (TID) | ORAL | Status: DC | PRN

## 2014-11-14 MED ORDER — ONDANSETRON 8 MG PO TBDP
ORAL_TABLET | ORAL | Status: AC
  Filled 2014-11-14: qty 1

## 2014-11-14 MED ORDER — KETOROLAC TROMETHAMINE 60 MG/2ML IM SOLN
60.0000 mg | Freq: Once | INTRAMUSCULAR | Status: AC
  Administered 2014-11-14: 60 mg via INTRAMUSCULAR

## 2014-11-14 MED ORDER — LOPERAMIDE HCL 2 MG PO CAPS
4.0000 mg | ORAL_CAPSULE | ORAL | Status: DC | PRN
  Administered 2014-11-14: 4 mg via ORAL

## 2014-11-14 MED ORDER — FAMOTIDINE 20 MG PO TABS
40.0000 mg | ORAL_TABLET | Freq: Once | ORAL | Status: AC
  Administered 2014-11-14: 40 mg via ORAL

## 2014-11-14 MED ORDER — KETOROLAC TROMETHAMINE 60 MG/2ML IM SOLN
INTRAMUSCULAR | Status: AC
  Filled 2014-11-14: qty 2

## 2014-11-14 MED ORDER — GI COCKTAIL ~~LOC~~
30.0000 mL | ORAL | Status: AC
Start: 2014-11-14 — End: 2014-11-14
  Administered 2014-11-14: 30 mL via ORAL

## 2014-11-14 MED ORDER — DEXAMETHASONE SODIUM PHOSPHATE 10 MG/ML IJ SOLN: 10.0000 mg | Freq: Once | INTRAMUSCULAR | Status: DC

## 2014-11-14 MED ORDER — RANITIDINE HCL 150 MG PO CAPS: 150.0000 mg | ORAL_CAPSULE | Freq: Two times a day (BID) | ORAL | Status: DC

## 2014-11-14 MED ORDER — ONDANSETRON 8 MG PO TBDP
8.0000 mg | ORAL_TABLET | Freq: Once | ORAL | Status: AC
  Administered 2014-11-14: 8 mg via ORAL

## 2014-11-14 MED ORDER — GI COCKTAIL ~~LOC~~
ORAL | Status: AC
  Filled 2014-11-14: qty 30

## 2014-11-14 NOTE — ED Notes (Signed)
MD at bedside. 

## 2014-11-14 NOTE — ED Notes (Addendum)
Pt given drink per request, states that she is feeling better. Will monitor to see if patient can keep fluids down. Denies feeling nauseated. EDP notified

## 2014-11-14 NOTE — ED Notes (Signed)
Pt alert and oriented X4, active, cooperative, pt in NAD. RR even and unlabored, color WNL.  Pt informed to return if any life threatening symptoms occur.   

## 2014-11-14 NOTE — ED Provider Notes (Signed)
[facility] Emergency Department Provider Note  ____________________________________________  Time seen: 7:10 AM  I have reviewed the triage vital signs and the nursing notes.   HISTORY  Chief Complaint Fever; Diarrhea; and Nausea    HPI Andrea Soto is a 54 y.o. female who was seen in the ED yesterday by me for 3 days of vomiting and diarrhea with recent tick exposure. She is also found to have a significant urinary tract infection. She was started on doxycycline and Bactrim yesterday, was rehydrated with IV fluids and given Zofran. She was feeling better and tolerating oral intake. Last night, she has had persistence of diarrhea. She has not had any vomiting although she does have nausea which has been relieved by Zofran and is continuing to tolerate oral fluids. She returns to the ED for repeat evaluation due to the persistent diarrhea. No new symptoms otherwise. She has been taking the antibiotics. Complains of generalized achy abdominal pain that is nonradiating and not aggravated or alleviated by anything. Only associated with the nausea and diarrhea, no chest pain shortness of breath syncope. She has subjective fever although no elevated temperatures at home.     History reviewed. No pertinent past medical history.  Patient Active Problem List   Diagnosis Date Noted  . 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 Depression, major, single episode, mild 10/14/2014  . Acid reflux 10/14/2014  . H/O transient cerebral ischemia 10/14/2014  . Hypercholesteremia 10/14/2014  . BP (high blood pressure) 10/14/2014  . 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 Diabetes mellitus, type 2 10/14/2014    Past Surgical History  Procedure Laterality Date  . Endometrial biopsy    . Breast biopsy  1978    Current Outpatient Rx  Name  Route  Sig  Dispense  Refill  . aspirin 81 MG tablet   Oral   Take by mouth.         Marland Kitchen atorvastatin (LIPITOR) 40 MG tablet   Oral   Take 1 tablet (40 mg  total) by mouth at bedtime.   90 tablet   3   . Coenzyme Q10 (COQ10) 200 MG CAPS   Oral   Take by mouth.         . dicyclomine (BENTYL) 20 MG tablet   Oral   Take 1 tablet (20 mg total) by mouth 3 (three) times daily as needed for spasms.   30 tablet   0   . doxycycline (VIBRAMYCIN) 100 MG capsule   Oral   Take 1 capsule (100 mg total) by mouth 2 (two) times daily.   28 capsule   0   . fluticasone (FLONASE) 50 MCG/ACT nasal spray   Each Nare   Place 2 sprays into both nostrils daily.   16 g   6   . hydrochlorothiazide (HYDRODIURIL) 25 MG tablet   Oral   Take 1 tablet (25 mg total) by mouth daily.   90 tablet   3   . meloxicam (MOBIC) 15 MG tablet   Oral   Take by mouth.         . metFORMIN (GLUCOPHAGE) 1000 MG tablet   Oral   Take 1 tablet (1,000 mg total) by mouth daily with breakfast.   90 tablet   3   . omeprazole (PRILOSEC OTC) 20 MG tablet   Oral   Take by mouth.         . ondansetron (ZOFRAN) 4 MG tablet   Oral   Take 1 tablet (4 mg total) by  mouth every 6 (six) hours as needed for nausea or vomiting.   20 tablet   1   . ranitidine (ZANTAC) 150 MG capsule   Oral   Take 1 capsule (150 mg total) by mouth 2 (two) times daily.   28 capsule   0   . sertraline (ZOLOFT) 100 MG tablet   Oral   Take 1 tablet (100 mg total) by mouth daily.   90 tablet   3   . sulfamethoxazole-trimethoprim (BACTRIM DS) 800-160 MG per tablet   Oral   Take 1 tablet by mouth 2 (two) times daily.   20 tablet   0     Allergies Review of patient's allergies indicates no known allergies.  Family History  Problem Relation Age of Onset  . Hypertension Mother   . Allergic rhinitis Mother   . Cancer Father     multiple myeloma and bladder cancer.  Marland Kitchen Heart attack Father   . Multiple myeloma Father   . Hypertension Sister   . Epilepsy Daughter   . Seizures Daughter   . Allergic rhinitis Son   . Breast cancer Paternal Grandmother   . Bone cancer Paternal  Grandmother     Social History History  Substance Use Topics  . Smoking status: Never Smoker   . Smokeless tobacco: Never Used  . Alcohol Use: 0.6 oz/week    1 Glasses of wine per week    Review of Systems  Constitutional: Subjective fever. No weight changes Eyes:No blurry vision or double vision.  ENT: No sore throat. Cardiovascular: No chest pain. Respiratory: No dyspnea or cough. Gastrointestinal: Generalized abdominal pain with nausea and diarrhea. No vomiting.Marland Kitchen  No BRBPR or melena. Genitourinary: Negative for dysuria, urinary retention, bloody urine, or difficulty urinating. Musculoskeletal: Negative for back pain. No joint swelling or pain. Skin: Negative for rash. Neurological: Negative for headaches, focal weakness or numbness. Psychiatric:No anxiety or depression.   Endocrine:No hot/cold intolerance, changes in energy, or sleep difficulty.  10-point ROS otherwise negative.  ____________________________________________   PHYSICAL EXAM:  VITAL SIGNS: ED Triage Vitals  Enc Vitals Group     BP 11/14/14 0627 115/73 mmHg     Pulse Rate 11/14/14 0627 95     Resp 11/14/14 0627 18     Temp 11/14/14 0627 99.1 F (37.3 C)     Temp Source 11/14/14 0627 Oral     SpO2 11/14/14 0627 96 %     Weight 11/14/14 0627 155 lb (70.308 kg)     Height 11/14/14 0627 _0  (1.575 m)     Head Cir --      Peak Flow --      Pain Score 11/14/14 0638 8     Pain Loc --      Pain Edu? --      Excl. in [place]? --      Constitutional: Alert and oriented. Low energy. Eyes: No scleral icterus. No conjunctival pallor. PERRL. EOMI ENT   Head: Normocephalic and atraumatic.   Nose: No congestion/rhinnorhea. No septal hematoma   Mouth/Throat: MMM, no pharyngeal erythema. No peritonsillar mass. No uvula shift.   Neck: No stridor. No SubQ emphysema. No meningismus. Hematological/Lymphatic/Immunilogical: No cervical lymphadenopathy. Cardiovascular: RRR. Normal and symmetric distal  pulses are present in all extremities. No murmurs, rubs, or gallops. Respiratory: Normal respiratory effort without tachypnea nor retractions. Breath sounds are clear and equal bilaterally. No wheezes/rales/rhonchi. Gastrointestinal: Suprapubic and left lower quadrant tenderness. There is no CVA tenderness.  No rebound, rigidity, or guarding.  Genitourinary: deferred Musculoskeletal: Nontender with normal range of motion in all extremities. No joint effusions.  No lower extremity tenderness.  No edema. Neurologic:   Normal speech and language.  CN 2-10 normal. Motor grossly intact. No pronator drift.  Normal gait. No gross focal neurologic deficits are appreciated.  Skin:  Skin is warm, dry and intact. No rash noted.  No petechiae, purpura, or bullae. Psychiatric: Flat affect. Normal speech and behavior, normal thought process and content.  ____________________________________________    LABS (pertinent positives/negatives) (all labs ordered are listed, but only abnormal results are displayed) Labs Reviewed - No data to display ____________________________________________   EKG    ____________________________________________    RADIOLOGY    ____________________________________________   PROCEDURES  ____________________________________________   INITIAL IMPRESSION / ASSESSMENT AND PLAN / ED COURSE  Pertinent labs & imaging results that were available during my care of the patient were reviewed by me and considered in my medical decision making (see chart for details).  Patient is well-appearing and nontoxic no acute distress. With her symptoms now being apparently due to tick exposure and definite cystitis and urinary tract infection, we can treat her GI symptoms more aggressively as there is very low suspicion for Escherichia coli and risk for HUS TTP. Decadron Zofran and Toradol Pepcid and Imodium. Patient continues to tolerate oral fluids so discharge her home. I  expect that this combination of medications will provide significant relief and we can continue her on Imodium at home. Again low suspicion for other acute surgical abdominal pathology such as appendicitis and ectopic torsion or PID cholecystitis AAA. The patient does have mental health history including depression and impaired coping skills, and I think that this does play a role in her current presentation as malaise seems to be by far the most dominant symptom, much more than her actual abdominal pain and diarrhea. She is encouraged to continue taking all of her home medications.  ____________________________________________   FINAL CLINICAL IMPRESSION(S) / ED DIAGNOSES  Final diagnoses:  Malaise and fatigue  Acute cystitis without hematuria      Carrie Mew, MD 11/14/14 [phone]

## 2014-11-14 NOTE — Discharge Instructions (Signed)

## 2014-11-14 NOTE — ED Notes (Signed)
Pt to triage via w/c with no dsitress noted; reports here yesterday for tick fever; returning for persistent fever, nausea and diarrhea

## 2014-11-15 ENCOUNTER — Telehealth: Payer: Self-pay | Admitting: Family Medicine

## 2014-11-15 NOTE — Telephone Encounter (Signed)
Spoke with patient and she will come in tomorrow at 1:45

## 2014-11-15 NOTE — Telephone Encounter (Signed)
I will about Thursday at 9:15 or 145?

## 2014-11-15 NOTE — Telephone Encounter (Signed)
Pt's husband Shanon Brow called to see when pt could come in for ER F/U visit with Dr. Rosanna Randy. Pt was scheduled to see Simona Huh Monday 11/13/14 and was sent to the ER. Pt had to go back to the ER on Tuesday. Both ER visits was because pt was vomiting and not able to hold anything on her stomach and was dehydrated. Can pt be worked in tomorrow 11/16/14? Please advise. Thanks TNP

## 2014-11-16 ENCOUNTER — Ambulatory Visit (INDEPENDENT_AMBULATORY_CARE_PROVIDER_SITE_OTHER): Payer: 59 | Admitting: Family Medicine

## 2014-11-16 ENCOUNTER — Encounter: Payer: Self-pay | Admitting: Family Medicine

## 2014-11-16 DIAGNOSIS — R110 Nausea: Secondary | ICD-10-CM | POA: Diagnosis not present

## 2014-11-16 DIAGNOSIS — Z09 Encounter for follow-up examination after completed treatment for conditions other than malignant neoplasm: Secondary | ICD-10-CM | POA: Diagnosis not present

## 2014-11-16 DIAGNOSIS — R51 Headache: Secondary | ICD-10-CM | POA: Diagnosis not present

## 2014-11-16 DIAGNOSIS — R197 Diarrhea, unspecified: Secondary | ICD-10-CM | POA: Diagnosis not present

## 2014-11-16 DIAGNOSIS — E860 Dehydration: Secondary | ICD-10-CM

## 2014-11-16 DIAGNOSIS — N3090 Cystitis, unspecified without hematuria: Secondary | ICD-10-CM | POA: Diagnosis not present

## 2014-11-16 DIAGNOSIS — R519 Headache, unspecified: Secondary | ICD-10-CM

## 2014-11-16 MED ORDER — DIPHENOXYLATE-ATROPINE 2.5-0.025 MG PO TABS: 1.0000 | ORAL_TABLET | Freq: Four times a day (QID) | ORAL | Status: DC | PRN

## 2014-11-16 MED ORDER — METRONIDAZOLE 500 MG PO TABS: 500.0000 mg | ORAL_TABLET | Freq: Three times a day (TID) | ORAL | Status: DC

## 2014-11-16 NOTE — Progress Notes (Signed)
Patient ID: Andrea Soto, female   DOB: 12-28-1960, 54 y.o.   MRN: [id number]   Analys Ryden  MRN: [id number] DOB: [birth year]  Subjective:  HPI  1. Hospital discharge follow-up Patient was seen in the office by Simona Huh on 11/13/14 with complaints of nausea, vomiting, diarrhea and fever.  She was found to have UTI/cystitis and dehydration.  She was sent to the ED for IV fluids.  She was in the ED getting fluids for several hours and was then discharged home.  He symptoms persisted and the next morning she had to return to the ED.  She was given Bentyl and reports that this seems to help her stomach some, but she continues with significant diarrhea, fatigue and headaches.  2. Cystitis Patient was not having any symptoms at the time of her diagnosis and continues to be free of urinary symptoms.  3. Diarrhea Patient continues to have watery bowel movements about every 4 hours.  She is pushing fluids and eating some but not much.  She had been having a very low grade fever and has not had any for about 36 hours.  4. Headache, unspecified headache type Patient has had a headache on and off during this time with the symptoms she has been having.  She states it is a little better but still present.  5. Dehydration Patient has been pushing fluids and has been able to keep them down without vomiting since 11/14/14.    6. Nausea She is no longer having any nausea, but still has no appetite and her stomach is still tender.  When she does eat anything her stomach cramps up quickly and then eases off.    Patient Active Problem List   Diagnosis Date Noted  . 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 Depression, major, single episode, mild 10/14/2014  . Acid reflux 10/14/2014  . H/O transient cerebral ischemia 10/14/2014  . Hypercholesteremia 10/14/2014  . BP (high blood pressure) 10/14/2014  . 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 Diabetes mellitus, type 2 10/14/2014    History reviewed. No pertinent past medical  history.  History   Social History  . Marital Status: Married    Spouse Name: [name]  . Number of Children: 2  . Years of Education: 16   Occupational History  . [employer]    Social History Main Topics  . Smoking status: Never Smoker   . Smokeless tobacco: Never Used  . Alcohol Use: 0.6 oz/week    1 Glasses of wine per week  . Drug Use: No  . Sexual Activity: Yes    Birth Control/ Protection: None   Other Topics Concern  . Not on file   Social History Narrative    Outpatient Prescriptions Prior to Visit  Medication Sig Dispense Refill  . aspirin EC 81 MG tablet Take 81 mg by mouth daily.    Marland Kitchen atorvastatin (LIPITOR) 40 MG tablet Take 1 tablet (40 mg total) by mouth at bedtime. (Patient taking differently: Take 40 mg by mouth daily. ) 90 tablet 3  . Coenzyme Q10 (COQ10) 200 MG CAPS Take by mouth.    . dicyclomine (BENTYL) 20 MG tablet Take 1 tablet (20 mg total) by mouth 3 (three) times daily as needed for spasms. 30 tablet 0  . doxycycline (VIBRAMYCIN) 100 MG capsule Take 1 capsule (100 mg total) by mouth 2 (two) times daily. 28 capsule 0  . fluticasone (FLONASE) 50 MCG/ACT nasal spray Place 2 sprays into both nostrils daily. 16 g 6  . hydrochlorothiazide (HYDRODIURIL) 25  MG tablet Take 1 tablet (25 mg total) by mouth daily. 90 tablet 3  . meloxicam (MOBIC) 15 MG tablet Take 15 mg by mouth as needed.     . metFORMIN (GLUCOPHAGE) 1000 MG tablet Take 1 tablet (1,000 mg total) by mouth daily with breakfast. 90 tablet 3  . omeprazole (PRILOSEC OTC) 20 MG tablet Take 20 mg by mouth daily.     . ondansetron (ZOFRAN) 4 MG tablet Take 1 tablet (4 mg total) by mouth every 6 (six) hours as needed for nausea or vomiting. 20 tablet 1  . ranitidine (ZANTAC) 150 MG capsule Take 1 capsule (150 mg total) by mouth 2 (two) times daily. 28 capsule 0  . sertraline (ZOLOFT) 100 MG tablet Take 1 tablet (100 mg total) by mouth daily. 90 tablet 3  . sulfamethoxazole-trimethoprim (BACTRIM DS) 800-160  MG per tablet Take 1 tablet by mouth 2 (two) times daily. 20 tablet 0  . aspirin 81 MG tablet Take by mouth.     No facility-administered medications prior to visit.    No Known Allergies  Review of Systems  Constitutional: Positive for malaise/fatigue. Negative for fever, chills and diaphoresis.  Respiratory: Negative.   Cardiovascular: Negative.   Gastrointestinal: Positive for abdominal pain and diarrhea. Negative for heartburn, nausea, vomiting, constipation, blood in stool and melena.       Stool is watery and dark green  Genitourinary: Negative.   Neurological: Positive for weakness and headaches.   Objective:  There were no vitals taken for this visit.  Physical Exam  Constitutional: She is well-developed, well-nourished, and in no distress.  Abdominal: Soft. She exhibits no distension and no mass. There is tenderness (Mild tenderness in the RLQ at [place] point). There is no rebound and no guarding.    Assessment and Plan :   1. Hospital discharge follow-up Patient being treated for colitis.  2. Cystitis   3. Diarrhea Check stool cultures and O&P area and the patient does not improve will treat presumptively for Giardia with Flagyl.  4. Headache, unspecified headache type    5. Dehydration Patient received IV fluids at  the hospital. Encouraged her to push by mouth fluids. Nausea and vomiting is resolved.  6. Nausea    [facility] 11/16/2014 2:19 PM

## 2014-11-21 LAB — STOOL CULTURE: E coli, Shiga toxin Assay: NEGATIVE

## 2014-11-21 LAB — OVA AND PARASITE EXAMINATION

## 2014-11-24 LAB — FECAL LACTOFERRIN, QUANT: Lactoferrin, Fecal, Quant.: 6.8 ug/mL(g) (ref 0.00–7.24)

## 2014-12-14 ENCOUNTER — Ambulatory Visit (INDEPENDENT_AMBULATORY_CARE_PROVIDER_SITE_OTHER): Payer: 59 | Admitting: Family Medicine

## 2014-12-14 ENCOUNTER — Other Ambulatory Visit: Payer: Self-pay | Admitting: Family Medicine

## 2014-12-14 ENCOUNTER — Encounter: Payer: Self-pay | Admitting: Family Medicine

## 2014-12-14 VITALS — BP 116/82 | HR 85 | Temp 98.1°F | Resp 16 | Wt 156.4 lb

## 2014-12-14 DIAGNOSIS — N3090 Cystitis, unspecified without hematuria: Secondary | ICD-10-CM | POA: Diagnosis not present

## 2014-12-14 DIAGNOSIS — R300 Dysuria: Secondary | ICD-10-CM | POA: Diagnosis not present

## 2014-12-14 LAB — POCT URINALYSIS DIPSTICK
Bilirubin, UA: NEGATIVE
Blood, UA: NEGATIVE
GLUCOSE UA: NEGATIVE
Ketones, UA: NEGATIVE
Nitrite, UA: NEGATIVE
Protein, UA: NEGATIVE
Urobilinogen, UA: 0.2
pH, UA: 5

## 2014-12-14 MED ORDER — FLUCONAZOLE 150 MG PO TABS: 150.0000 mg | ORAL_TABLET | Freq: Once | ORAL | Status: DC

## 2014-12-14 MED ORDER — CIPROFLOXACIN HCL 250 MG PO TABS: 250.0000 mg | ORAL_TABLET | Freq: Two times a day (BID) | ORAL | Status: DC

## 2014-12-14 NOTE — Progress Notes (Signed)
Subjective:     Patient ID: Andrea Soto, female   DOB: 1961-02-18, 54 y.o.   MRN: [id number]  HPI  Chief Complaint  Patient presents with  . Urinary Frequency    patient comes in today with UTI symptoms. Patient reported that symptoms began on Sunday evening, she complains of frequency, burning and pelvic pressure. Patient has recently finished taking antibiotics and is usnsure if that caused her symptoms  Reports completing a course of doxycycline and Bactrim in late June and early July for tick exposure and cystitis.   Review of Systems  Constitutional: Positive for fatigue. Negative for fever and chills.       Objective:   Physical Exam  Constitutional: She appears well-developed and well-nourished. No distress.  Genitourinary:  No cva tenderness       Assessment:    1. Dysuria - POCT urinalysis dipstick - Urine culture  2. Cystiti - POCT urinalysis dipstick - Urine culture - ciprofloxacin (CIPRO) 250 MG tablet; Take 1 tablet (250 mg total) by mouth 2 (two) times daily.  Dispense: 14 tablet; Refill: 0 - fluconazole (DIFLUCAN) 150 MG tablet; Take 1 tablet (150 mg total) by mouth once.  Dispense: 1 tablet; Refill: 0    Plan:    Further f/u pending urine culture.

## 2014-12-14 NOTE — Patient Instructions (Signed)
Discussed use of AZO or Uristat for symptoms.

## 2014-12-16 LAB — URINE CULTURE

## 2014-12-18 ENCOUNTER — Telehealth: Payer: Self-pay

## 2014-12-18 NOTE — Telephone Encounter (Signed)
Patient has been advised

## 2014-12-18 NOTE — Telephone Encounter (Signed)
-----   Message from Carmon Ginsberg, [place] sent at 12/18/2014  7:58 AM EDT ----- Continue Cipro for Proteus bladder infection

## 2014-12-23 ENCOUNTER — Other Ambulatory Visit: Payer: Self-pay | Admitting: Family Medicine

## 2015-01-23 ENCOUNTER — Other Ambulatory Visit: Payer: Self-pay | Admitting: Family Medicine

## 2015-03-08 ENCOUNTER — Encounter: Payer: Self-pay | Admitting: Family Medicine

## 2015-03-08 ENCOUNTER — Ambulatory Visit (INDEPENDENT_AMBULATORY_CARE_PROVIDER_SITE_OTHER): Payer: 59 | Admitting: Family Medicine

## 2015-03-08 VITALS — BP 104/68 | HR 84 | Temp 98.7°F | Resp 16 | Ht 62.0 in | Wt 157.0 lb

## 2015-03-08 DIAGNOSIS — Z23 Encounter for immunization: Secondary | ICD-10-CM

## 2015-03-08 DIAGNOSIS — I10 Essential (primary) hypertension: Secondary | ICD-10-CM | POA: Diagnosis not present

## 2015-03-08 DIAGNOSIS — Z794 Long term (current) use of insulin: Secondary | ICD-10-CM | POA: Diagnosis not present

## 2015-03-08 DIAGNOSIS — E119 Type 2 diabetes mellitus without complications: Secondary | ICD-10-CM | POA: Diagnosis not present

## 2015-03-08 LAB — POCT GLYCOSYLATED HEMOGLOBIN (HGB A1C): HEMOGLOBIN A1C: 7.5

## 2015-03-08 NOTE — Progress Notes (Signed)
Patient ID: Andrea Soto, female   DOB: Nov 23, 1960, 54 y.o.   MRN: [id number]   Andrea Soto  MRN: [id number] DOB: 1960/11/06  Subjective:  HPI   1. Type 2 diabetes mellitus without complication, with long-term current use of insulin [facility]) Patient is a 55 year old female who presents for follow up of her diabetes.  Her last visit for this was on 11/02/14.  No management changes were made at that time.  Her A1C on that date was 7.7.  She does not check her glucose at home.  She reports no side effects of her diabetic medication which include Metformin.  2. Essential hypertension Patient is also here for follow up of her hypertension.  She does check her BP outside of the office and reports it routinely running 110s systolilc and 31D-17 diastolic.  She is currently on HCTZ and reports no side effects from that medication.    Patient has also requested to receive her flu vaccine today.   Patient Active Problem List   Diagnosis Date Noted  . 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 Depression, major, single episode, mild ([place]) 10/14/2014  . Acid reflux 10/14/2014  . H/O transient cerebral ischemia 10/14/2014  . Hypercholesteremia 10/14/2014  . BP (high blood pressure) 10/14/2014  . 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 Diabetes mellitus, type 2 ([place]) 10/14/2014    Past Medical History  Diagnosis Date  . Depression   . Hyperlipidemia     Social History   Social History  . Marital Status: Married    Spouse Name: [name]  . Number of Children: 2  . Years of Education: 16   Occupational History  . [employer]    Social History Main Topics  . Smoking status: Never Smoker   . Smokeless tobacco: Never Used  . Alcohol Use: 0.6 oz/week    1 Glasses of wine per week  . Drug Use: No  . Sexual Activity: Yes    Birth Control/ Protection: None   Other Topics Concern  . Not on file   Social History Narrative    Outpatient Prescriptions Prior to Visit  Medication Sig Dispense Refill  . aspirin 81 MG  tablet Take by mouth.    Marland Kitchen atorvastatin (LIPITOR) 40 MG tablet Take 1 tablet (40 mg total) by mouth at bedtime. (Patient taking differently: Take 40 mg by mouth daily. ) 90 tablet 3  . Coenzyme Q10 (COQ10) 200 MG CAPS Take by mouth.    . fluticasone (FLONASE) 50 MCG/ACT nasal spray Place 2 sprays into both nostrils daily. 16 g 6  . hydrochlorothiazide (HYDRODIURIL) 25 MG tablet Take 1 tablet (25 mg total) by mouth daily. 90 tablet 3  . meloxicam (MOBIC) 15 MG tablet Take 15 mg by mouth as needed.     . metFORMIN (GLUCOPHAGE) 1000 MG tablet Take 1 tablet by mouth  daily 90 tablet 3  . omeprazole (PRILOSEC OTC) 20 MG tablet Take 20 mg by mouth daily.     . ondansetron (ZOFRAN) 4 MG tablet Take 1 tablet (4 mg total) by mouth every 6 (six) hours as needed for nausea or vomiting. 20 tablet 1  . sertraline (ZOLOFT) 100 MG tablet Take 1 tablet by mouth  daily 90 tablet 3  . ciprofloxacin (CIPRO) 250 MG tablet Take 1 tablet (250 mg total) by mouth 2 (two) times daily. (Patient not taking: Reported on 03/08/2015) 14 tablet 0  . fluconazole (DIFLUCAN) 150 MG tablet Take 1 tablet (150 mg total) by mouth once. (Patient not taking: Reported  on 03/08/2015) 1 tablet 0  . metroNIDAZOLE (FLAGYL) 500 MG tablet Take 1 tablet (500 mg total) by mouth 3 (three) times daily. (Patient not taking: Reported on 12/14/2014) 21 tablet 0   No facility-administered medications prior to visit.    No Known Allergies  Review of Systems  Constitutional: Negative.   Respiratory: Negative.   Cardiovascular: Negative.   Genitourinary: Negative.   Neurological: Negative for dizziness and headaches.  Endo/Heme/Allergies: Negative for polydipsia.   Objective:  BP 104/68 mmHg  Pulse 84  Temp(Src) 98.7 F (37.1 C) (Oral)  Resp 16  Ht 5\' 2"  (1.575 m)  Wt 157 lb (71.215 kg)  BMI 28.71 kg/m2  Physical Exam  Constitutional: She is oriented to person, place, and time and well-developed, well-nourished, and in no distress.   HENT:  Head: Normocephalic and atraumatic.  Right Ear: External ear normal.  Left Ear: External ear normal.  Nose: Nose normal.  Eyes: Conjunctivae are normal.  Neck: Neck supple.  Cardiovascular: Normal rate, regular rhythm and normal heart sounds.   Pulmonary/Chest: Effort normal and breath sounds normal.  Abdominal: Soft.  Neurological: She is alert and oriented to person, place, and time.  Skin: Skin is warm.  Psychiatric: Mood, memory, affect and judgment normal.    Assessment and Plan :  Type 2 diabetes mellitus without complication, with long-term current use of insulin (HCC)  Essential hypertension  A1C is 7.5 today.No changes   HLD  I have done the exam and reviewed the above chart and it is accurate to the best of my knowledge.  Miguel Aschoff MD [facility] 03/08/2015 8:13 AM

## 2015-04-19 ENCOUNTER — Other Ambulatory Visit: Payer: Self-pay | Admitting: Family Medicine

## 2015-07-06 ENCOUNTER — Encounter: Payer: Self-pay | Admitting: Physician Assistant

## 2015-07-06 ENCOUNTER — Ambulatory Visit (INDEPENDENT_AMBULATORY_CARE_PROVIDER_SITE_OTHER): Payer: 59 | Admitting: Physician Assistant

## 2015-07-06 VITALS — BP 138/80 | HR 85 | Temp 98.0°F | Resp 16 | Wt 160.4 lb

## 2015-07-06 DIAGNOSIS — M545 Low back pain, unspecified: Secondary | ICD-10-CM

## 2015-07-06 DIAGNOSIS — N3000 Acute cystitis without hematuria: Secondary | ICD-10-CM

## 2015-07-06 DIAGNOSIS — E119 Type 2 diabetes mellitus without complications: Secondary | ICD-10-CM | POA: Diagnosis not present

## 2015-07-06 LAB — POCT URINALYSIS DIPSTICK
Bilirubin, UA: NEGATIVE
Glucose, UA: 500
KETONES UA: NEGATIVE
Leukocytes, UA: NEGATIVE
Nitrite, UA: NEGATIVE
PH UA: 6.5
PROTEIN UA: NEGATIVE
RBC UA: NEGATIVE
SPEC GRAV UA: 1.01
Urobilinogen, UA: 0.2

## 2015-07-06 LAB — POCT GLYCOSYLATED HEMOGLOBIN (HGB A1C)
ESTIMATED AVERAGE GLUCOSE: 189
HEMOGLOBIN A1C: 8.2

## 2015-07-06 MED ORDER — SULFAMETHOXAZOLE-TRIMETHOPRIM 800-160 MG PO TABS: 1.0000 | ORAL_TABLET | Freq: Two times a day (BID) | ORAL | Status: DC

## 2015-07-06 MED ORDER — METFORMIN HCL 1000 MG PO TABS: 1000.0000 mg | ORAL_TABLET | Freq: Two times a day (BID) | ORAL | Status: DC

## 2015-07-06 NOTE — Progress Notes (Signed)
Patient: Andrea Soto Female    DOB: Jul 04, 1960   55 y.o.   MRN: [id number] Visit Date: 07/06/2015  Today's Provider: Mar Daring, PA-C   Chief Complaint  Patient presents with  . Back Pain   Subjective:    Back Pain This is a new problem. The current episode started yesterday. The problem occurs intermittently (comes and goes). The problem is unchanged. The pain does not radiate. The pain is at a severity of 2/10. The pain is the same all the time. Associated symptoms include abdominal pain (feels pressure on her lower abdomen). Pertinent negatives include no dysuria or fever. (Lower back pain) Treatments tried: Advil and Cranberry juice. The treatment provided no relief.   She does state that she gets a typical urinary tract infections and this does feel similar to her previous urinary tract infections. She stated that she wanted to come in even though she knows it early for her symptoms so that she did not go all weekend without having treatment because she was put in the hospital for a urinary tract infection last summer. These are recurrent and most recent documented UTI in our records was July 2016. She does state however that [facility].  Diabetes: Patient is a 55 year old female who presents for follow up of her diabetes. Her last visit for this was on 03/08/15. No management changes were made at that time. Hgb A1C was 7.5. She is currently taking Metformin 1000 mg once daily.  she is trying to adhere to a diabetic diet but states she does slip every once in all. Patient doesn't have any side effect from medicine and denies having blurry vision,excessive thirst, swelling or chest pain.    No Known Allergies Previous Medications   ASPIRIN 81 MG TABLET    Take by mouth.   ATORVASTATIN (LIPITOR) 40 MG TABLET    Take 1 tablet by mouth  daily   CIPROFLOXACIN (CIPRO) 250 MG TABLET    Take 1 tablet (250 mg total) by mouth 2 (two) times daily.     COENZYME Q10 (COQ10) 200 MG CAPS    Take by mouth.   FLUCONAZOLE (DIFLUCAN) 150 MG TABLET    Take 1 tablet (150 mg total) by mouth once.   FLUTICASONE (FLONASE) 50 MCG/ACT NASAL SPRAY    Place 2 sprays into both nostrils daily.   HYDROCHLOROTHIAZIDE (HYDRODIURIL) 25 MG TABLET    Take 1 tablet by mouth  daily   MELOXICAM (MOBIC) 15 MG TABLET    Take 15 mg by mouth as needed.    METFORMIN (GLUCOPHAGE) 1000 MG TABLET    Take 1 tablet by mouth  daily   METRONIDAZOLE (FLAGYL) 500 MG TABLET    Take 1 tablet (500 mg total) by mouth 3 (three) times daily.   OMEPRAZOLE (PRILOSEC OTC) 20 MG TABLET    Take 20 mg by mouth daily.    ONDANSETRON (ZOFRAN) 4 MG TABLET    Take 1 tablet (4 mg total) by mouth every 6 (six) hours as needed for nausea or vomiting.   SERTRALINE (ZOLOFT) 100 MG TABLET    Take 1 tablet by mouth  daily    Review of Systems  Constitutional: Positive for fatigue (very tired). Negative for fever and chills.  HENT: Negative.   Respiratory: Negative.   Cardiovascular: Negative.   Gastrointestinal: Positive for abdominal pain (feels pressure on her lower abdomen). Negative for nausea and vomiting.  Genitourinary: Positive for flank pain. Negative for dysuria, urgency, frequency and vaginal discharge.  Neurological: Negative.     Social History  Substance Use Topics  . Smoking status: Never Smoker   . Smokeless tobacco: Never Used  . Alcohol Use: 0.6 oz/week    1 Glasses of wine per week   Objective:   BP 138/80 mmHg  Pulse 85  Temp(Src) 98 F (36.7 C) (Oral)  Resp 16  Wt 160 lb 6.4 oz (72.757 kg)  Physical Exam  Constitutional: She is oriented to person, place, and time. She appears well-developed and well-nourished. No distress.  Cardiovascular: Normal rate, regular rhythm and normal heart sounds.  Exam reveals no gallop and no friction rub.   No murmur heard. Pulmonary/Chest: Effort normal and breath sounds normal. No respiratory distress. She has no wheezes. She has  no rales.  Abdominal: Soft. Normal appearance and bowel sounds are normal. She exhibits no distension and no mass. There is no hepatosplenomegaly. There is tenderness in the suprapubic area. There is no rebound, no guarding and no CVA tenderness (mild discomfort on left side not a true CVA tenderness).  Suprapubic pressure more so than tenderness  Neurological: She is alert and oriented to person, place, and time.  Skin: Skin is warm and dry. She is not diaphoretic.        Assessment & Plan:     1. Type 2 diabetes mellitus without complication, unspecified long term insulin use status (HCC) Hemoglobin A1c has increased to 8.2 discussed making sure to adhere to a diabetic diet and limit sugars and carbohydrates. Will increase metformin as below to twice daily. She will follow-up with either myself or Dr. Rosanna Randy in 3 months for repeat hemoglobin A1c to see if it is more better controlled. She is to call the office if she has any adverse reactions, acute issues, questions or concerns in the meantime. - POCT glycosylated hemoglobin (Hb A1C) - metFORMIN (GLUCOPHAGE) 1000 MG tablet; Take 1 tablet (1,000 mg total) by mouth 2 (two) times daily with a meal.  Dispense: 180 tablet; Refill: 1  2. Midline low back pain without sciatica States symptoms are consistent with previous urinary tract infections. UA today was negative for leukocytes or nitrites. However she is spilling glucose in her urine. I did discuss that this could possibly be causing some of her symptoms but also makes her susceptible to urinary tract infections and/or yeast infections. I did advise her to please call the office if she developed any vaginal discharge or irritation. I will send her urine for culture. I will empirically treat with Bactrim as below. I will adjust antibiotic therapy or discontinue antibiotic therapy depending on results of culture and sensitivities. She is to continue to push fluids. She may continue Advil and  cranberry for symptomatic relief. She is to call the office if symptoms fail to improve or worsen. - POCT urinalysis dipstick - Urine Culture  3. Acute cystitis without hematuria See above medical treatment plan for #2. - sulfamethoxazole-trimethoprim (BACTRIM DS,SEPTRA DS) 800-160 MG tablet; Take 1 tablet by mouth 2 (two) times daily.  Dispense: 10 tablet; Refill: 0 - Urine Culture       Mar Daring, PA-C  [facility]

## 2015-07-06 NOTE — Patient Instructions (Signed)

## 2015-07-08 LAB — URINE CULTURE

## 2015-07-09 ENCOUNTER — Telehealth: Payer: Self-pay

## 2015-07-09 ENCOUNTER — Ambulatory Visit: Payer: 59 | Admitting: Family Medicine

## 2015-07-09 MED ORDER — NITROFURANTOIN MONOHYD MACRO 100 MG PO CAPS: 100.0000 mg | ORAL_CAPSULE | Freq: Two times a day (BID) | ORAL | Status: DC

## 2015-07-09 NOTE — Telephone Encounter (Signed)
Patient advised as directed below.  Thanks,  -Miyanna Wiersma 

## 2015-07-09 NOTE — Telephone Encounter (Signed)
-----   Message from Mar Daring, [place] sent at 07/09/2015  8:10 AM EST ----- Urine culture grew out e.coli that is resistant to the bactrim you were placed on. I do apologize. I will send in new antibiotic for better coverage to pharmacy on file.  Please call if symptoms persist after new treatment.

## 2015-07-09 NOTE — Addendum Note (Signed)
Addended by: Mar Daring on: 07/09/2015 08:11 AM   Modules accepted: Orders, Medications

## 2015-07-12 LAB — HM DIABETES EYE EXAM

## 2015-07-27 ENCOUNTER — Encounter: Payer: Self-pay | Admitting: Family Medicine

## 2015-09-24 ENCOUNTER — Encounter: Payer: Self-pay | Admitting: Physician Assistant

## 2015-09-24 ENCOUNTER — Ambulatory Visit (INDEPENDENT_AMBULATORY_CARE_PROVIDER_SITE_OTHER): Payer: 59 | Admitting: Physician Assistant

## 2015-09-24 VITALS — BP 112/78 | HR 82 | Temp 97.2°F | Resp 16 | Wt 152.0 lb

## 2015-09-24 DIAGNOSIS — H6983 Other specified disorders of Eustachian tube, bilateral: Secondary | ICD-10-CM | POA: Diagnosis not present

## 2015-09-24 DIAGNOSIS — R42 Dizziness and giddiness: Secondary | ICD-10-CM | POA: Diagnosis not present

## 2015-09-24 DIAGNOSIS — E860 Dehydration: Secondary | ICD-10-CM | POA: Diagnosis not present

## 2015-09-24 NOTE — Progress Notes (Signed)
Patient: Andrea Soto Female    DOB: 1960/07/16   55 y.o.   MRN: [id number] Visit Date: 09/24/2015  Today's Provider: Mar Daring, PA-C   Chief Complaint  Patient presents with  . Dizziness   Subjective:    Dizziness This is a new problem. The current episode started in the past 7 days (Friday morning). Associated symptoms include fatigue. Pertinent negatives include no abdominal pain, chest pain, congestion, coughing, fever (She did run a fever  last week. Thursday, Friday, and Saturday. Temperatures were between 99-101.0 ), headaches, nausea, sore throat, urinary symptoms, vertigo, visual change, vomiting or weakness. Associated symptoms comments: She had vomiting and diarrhea Saturday. She was hurting all over and aching when she had the fever.. Exacerbated by: with movement. She has tried drinking for the symptoms.  She mentions having a bout of gastroenteritis over the weekend, which symptoms have completely resolved. Yet she continues to have dizziness with standing and occasional head movements. Dizziness is not associated with tinnitus, hearing loss, nausea, vomiting, chest pain, SOB, or vision changes (blurred or double).     No Known Allergies Previous Medications   ASPIRIN 81 MG TABLET    Take by mouth.   ATORVASTATIN (LIPITOR) 40 MG TABLET    Take 1 tablet by mouth  daily   COENZYME Q10 (COQ10) 200 MG CAPS    Take by mouth.   HYDROCHLOROTHIAZIDE (HYDRODIURIL) 25 MG TABLET    Take 1 tablet by mouth  daily   METFORMIN (GLUCOPHAGE) 1000 MG TABLET    Take 1 tablet (1,000 mg total) by mouth 2 (two) times daily with a meal.   NITROFURANTOIN, MACROCRYSTAL-MONOHYDRATE, (MACROBID) 100 MG CAPSULE    Take 1 capsule (100 mg total) by mouth 2 (two) times daily.   OMEPRAZOLE (PRILOSEC OTC) 20 MG TABLET    Take 20 mg by mouth daily.    SERTRALINE (ZOLOFT) 100 MG TABLET    Take 1 tablet by mouth  daily    Review of Systems  Constitutional: Positive for appetite change and  fatigue. Negative for fever (She did run a fever  last week. Thursday, Friday, and Saturday. Temperatures were between 99-101.0 ).  HENT: Positive for postnasal drip. Negative for congestion, ear pain, rhinorrhea, sinus pressure, sneezing, sore throat and tinnitus.   Eyes: Negative for visual disturbance.  Respiratory: Negative for cough, chest tightness and shortness of breath.   Cardiovascular: Negative for chest pain, palpitations and leg swelling.  Gastrointestinal: Negative for nausea, vomiting and abdominal pain.  Neurological: Positive for dizziness. Negative for vertigo, weakness and headaches.    Social History  Substance Use Topics  . Smoking status: Never Smoker   . Smokeless tobacco: Never Used  . Alcohol Use: 0.6 oz/week    1 Glasses of wine per week   Objective:   BP 112/78 mmHg  Pulse 82  Temp(Src) 97.2 F (36.2 C) (Oral)  Resp 16  Wt 152 lb (68.947 kg)  Physical Exam  Constitutional: She is oriented to person, place, and time. She appears well-developed and well-nourished. No distress.  HENT:  Head: Normocephalic and atraumatic.  Right Ear: Hearing, external ear and ear canal normal. Tympanic membrane is not erythematous and not bulging. A middle ear effusion is present.  Left Ear: Hearing, external ear and ear canal normal. Tympanic membrane is not erythematous and not bulging. A middle ear effusion is present.  Nose: Mucosal edema present. No rhinorrhea. Right sinus exhibits no maxillary sinus tenderness and no frontal  sinus tenderness. Left sinus exhibits no maxillary sinus tenderness and no frontal sinus tenderness.  Mouth/Throat: Uvula is midline, oropharynx is clear and moist and mucous membranes are normal. No oropharyngeal exudate, posterior oropharyngeal edema or posterior oropharyngeal erythema.  Eyes: Conjunctivae are normal. Pupils are equal, round, and reactive to light. Right eye exhibits no discharge. Left eye exhibits no discharge. No scleral icterus.    Neck: Normal range of motion. Neck supple. No tracheal deviation present. No thyromegaly present.  Cardiovascular: Normal rate, regular rhythm and normal heart sounds.  Exam reveals no gallop and no friction rub.   No murmur heard. Pulmonary/Chest: Effort normal and breath sounds normal. No stridor. No respiratory distress. She has no wheezes. She has no rales.  Lymphadenopathy:    She has no cervical adenopathy.  Neurological: She is alert and oriented to person, place, and time. She has normal strength. No cranial nerve deficit or sensory deficit. She displays a negative Romberg sign. Coordination and gait normal.  No gross deficits noted  Skin: Skin is warm and dry. She is not diaphoretic.  Vitals reviewed.       Assessment & Plan:     1. Dehydration Will have her hold her HCTZ until Friday 09/28/15 as I feel the dizziness is secondary to dehydration from the GI event this past weekend and may be exacerbated by her HCTZ. Blood pressure was stable so I feel safe holding this medication. Advised she may also eat a banana to help replenish potassium. Add magnesium 250mg  OTC supplement for the week as well, then go to as needed use if she has leg cramps. Continue to push fluids. Also advised to add flonase 2 sprays per nostril daily for the fluid that was noted behind the TM bilaterally. She voices understanding and agrees. If the dizziness persists with these changes, if it returns when she restarts her HCTZ, if dizziness worsens and causes unsteady gait, is associated with N/V or if she develops double vision, she is to call the office.   2. Dizziness See above medical treatment plan.  3. ETD (eustachian tube dysfunction), bilateral See above medical treatment plan.        Mar Daring, PA-C  [facility]

## 2015-09-24 NOTE — Patient Instructions (Signed)

## 2015-10-01 ENCOUNTER — Ambulatory Visit (INDEPENDENT_AMBULATORY_CARE_PROVIDER_SITE_OTHER): Payer: 59 | Admitting: Family Medicine

## 2015-10-01 ENCOUNTER — Encounter: Payer: Self-pay | Admitting: Family Medicine

## 2015-10-01 VITALS — BP 132/70 | HR 80 | Temp 97.8°F | Resp 16 | Wt 154.0 lb

## 2015-10-01 DIAGNOSIS — E7800 Pure hypercholesterolemia, unspecified: Secondary | ICD-10-CM

## 2015-10-01 DIAGNOSIS — I10 Essential (primary) hypertension: Secondary | ICD-10-CM

## 2015-10-01 DIAGNOSIS — F320 Major depressive disorder, single episode, mild: Secondary | ICD-10-CM

## 2015-10-01 DIAGNOSIS — E119 Type 2 diabetes mellitus without complications: Secondary | ICD-10-CM

## 2015-10-01 LAB — POCT GLYCOSYLATED HEMOGLOBIN (HGB A1C): HEMOGLOBIN A1C: 7.1

## 2015-10-01 NOTE — Progress Notes (Signed)
Patient ID: Andrea Soto, female   DOB: [birth year], 55 y.o.   MRN: [id number]    Subjective:  HPI  Diabetes Mellitus Type II, Follow-up:   Lab Results  Component Value Date   HGBA1C 8.2 07/06/2015   HGBA1C 7.5 03/08/2015   HGBA1C 7.7 11/02/2014    Last seen for diabetes 3 months ago.  Management since then includes increasing Metformin to BID. She reports good compliance with treatment. She is not having side effects.  Current symptoms include none and have been unchanged. Home blood sugar records: 130's  Episodes of hypoglycemia? no   Current Insulin Regimen: n/a Most Recent Eye Exam: 05/2015 Current exercise: walking once a week.  Pertinent Labs:    Component Value Date/Time   CHOL 164 06/15/2014   TRIG 171* 06/15/2014   HDL 36 06/15/2014   LDLCALC 94 06/15/2013   CREATININE 0.64 11/13/2014 1224   CREATININE 0.8 06/15/2014    Wt Readings from Last 3 Encounters:  10/01/15 154 lb (69.854 kg)  09/24/15 152 lb (68.947 kg)  07/06/15 160 lb 6.4 oz (72.757 kg)    ------------------------------------------------------------------------  Pt was seen on 09/24/15 by Tawanna Sat for dehydration. Held HCTZ until 09/28/15 add Flonase and MagOx. Pt is feeling much better since this visit. She wants to know if she needs to continue taking the MagOx.    Prior to Admission medications   Medication Sig Start Date End Date Taking? Authorizing Provider  aspirin 81 MG tablet Take by mouth.   Yes Historical Provider, MD  atorvastatin (LIPITOR) 40 MG tablet Take 1 tablet by mouth  daily 04/20/15  Yes Richard Maceo Pro., MD  Coenzyme Q10 (COQ10) 200 MG CAPS Take by mouth. 04/02/11  Yes Historical Provider, MD  hydrochlorothiazide (HYDRODIURIL) 25 MG tablet Take 1 tablet by mouth  daily 04/20/15  Yes Richard Maceo Pro., MD  magnesium oxide (MAG-OX) 400 MG tablet Take 400 mg by mouth daily.   Yes Historical Provider, MD  metFORMIN (GLUCOPHAGE) 1000 MG tablet Take 1 tablet (1,000 mg total) by  mouth 2 (two) times daily with a meal. 07/06/15  Yes Clearnce Sorrel Burnette, PA-C  omeprazole (PRILOSEC OTC) 20 MG tablet Take 20 mg by mouth daily.  04/02/11  Yes Historical Provider, MD  sertraline (ZOLOFT) 100 MG tablet Take 1 tablet by mouth  daily 12/25/14  Yes Richard Maceo Pro., MD    Patient Active Problem List   Diagnosis Date Noted  . 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 Depression, major, single episode, mild ([place]) 10/14/2014  . Acid reflux 10/14/2014  . H/O transient cerebral ischemia 10/14/2014  . Hypercholesteremia 10/14/2014  . BP (high blood pressure) 10/14/2014  . 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 Diabetes mellitus, type 2 ([place]) 10/14/2014    Past Medical History  Diagnosis Date  . Depression   . Hyperlipidemia     Social History   Social History  . Marital Status: Married    Spouse Name: [name]  . Number of Children: 2  . Years of Education: 16   Occupational History  . [employer]    Social History Main Topics  . Smoking status: Never Smoker   . Smokeless tobacco: Never Used  . Alcohol Use: 0.6 oz/week    1 Glasses of wine per week  . Drug Use: No  . Sexual Activity: Yes    Birth Control/ Protection: None   Other Topics Concern  . Not on file   Social History Narrative    No Known Allergies  Review of  Systems  Constitutional: Negative.   HENT: Negative.   Eyes: Negative.   Respiratory: Negative.   Cardiovascular: Negative.   Gastrointestinal: Negative.   Genitourinary: Negative.   Musculoskeletal: Negative.   Skin: Negative.   Neurological: Negative.   Endo/Heme/Allergies: Negative.   Psychiatric/Behavioral: Negative.     Immunization History  Administered Date(s) Administered  . Influenza,inj,Quad PF,36+ Mos 03/08/2015  . Pneumococcal Polysaccharide-23 04/08/2012  . Tdap 07/21/2007   Objective:  BP 132/70 mmHg  Pulse 80  Temp(Src) 97.8 F (36.6 C) (Oral)  Resp 16  Wt 154 lb (69.854 kg)  Physical Exam  Constitutional: She is oriented to  person, place, and time and well-developed, well-nourished, and in no distress.  HENT:  Head: Normocephalic and atraumatic.  Right Ear: External ear normal.  Left Ear: External ear normal.  Nose: Nose normal.  Eyes: Conjunctivae and EOM are normal. Pupils are equal, round, and reactive to light.  Neck: Normal range of motion. Neck supple.  Cardiovascular: Normal rate, regular rhythm, normal heart sounds and intact distal pulses.   Pulmonary/Chest: Effort normal and breath sounds normal.  Abdominal: Soft. Bowel sounds are normal.  Musculoskeletal: Normal range of motion.  Neurological: She is alert and oriented to person, place, and time. She has normal reflexes. Gait normal. GCS score is 15.  Skin: Skin is warm and dry.  Psychiatric: Mood, memory, affect and judgment normal.    Lab Results  Component Value Date   WBC 7.2 11/13/2014   HGB 13.8 11/13/2014   HCT 41.6 11/13/2014   PLT 188 11/13/2014   GLUCOSE 175* 11/13/2014   CHOL 164 06/15/2014   TRIG 171* 06/15/2014   HDL 36 06/15/2014   LDLCALC 94 06/15/2013   TSH 2.66 06/15/2014   HGBA1C 8.2 07/06/2015    CMP     Component Value Date/Time   NA 136 11/13/2014 1224   NA 141 06/15/2014   K 3.0* 11/13/2014 1224   CL 101 11/13/2014 1224   CO2 27 11/13/2014 1224   GLUCOSE 175* 11/13/2014 1224   BUN 17 11/13/2014 1224   BUN 16 06/15/2014   CREATININE 0.64 11/13/2014 1224   CREATININE 0.8 06/15/2014   CALCIUM 8.2* 11/13/2014 1224   PROT 6.9 11/13/2014 1224   ALBUMIN 3.9 11/13/2014 1224   AST 27 11/13/2014 1224   ALT 33 11/13/2014 1224   ALKPHOS 77 11/13/2014 1224   BILITOT 0.6 11/13/2014 1224   GFRNONAA >60 11/13/2014 1224   GFRAA >60 11/13/2014 1224    Assessment and Plan :  1. Type 2 diabetes mellitus without complication, unspecified long term insulin use status (HCC)  Thin monofilament foot exam is normal. - POCT HgB A1C 7.1 today-- Improved continue Metformin BID, Diet and exercise. Follow up 3-4 months.    2. Hypercholesteremia  - Lipid Panel With LDL/HDL Ratio  3. Essential hypertension  - CBC with Differential/Platelet - TSH  4. Depression, major, single episode, mild [facility])   Patient was seen and examined by Dr. Miguel Aschoff, and noted scribed by Webb Laws, [place] MD [facility] 10/01/2015 4:03 PM

## 2015-10-11 ENCOUNTER — Ambulatory Visit: Payer: 59 | Admitting: Family Medicine

## 2015-12-09 ENCOUNTER — Other Ambulatory Visit: Payer: Self-pay | Admitting: Family Medicine

## 2015-12-22 LAB — CBC WITH DIFFERENTIAL/PLATELET
BASOS ABS: 0 10*3/uL (ref 0.0–0.2)
Basos: 0 %
EOS (ABSOLUTE): 0.3 10*3/uL (ref 0.0–0.4)
Eos: 4 %
HEMOGLOBIN: 13.7 g/dL (ref 11.1–15.9)
Hematocrit: 41.5 % (ref 34.0–46.6)
Immature Grans (Abs): 0 10*3/uL (ref 0.0–0.1)
Immature Granulocytes: 0 %
LYMPHS ABS: 2 10*3/uL (ref 0.7–3.1)
Lymphs: 24 %
MCH: 26.8 pg (ref 26.6–33.0)
MCHC: 33 g/dL (ref 31.5–35.7)
MCV: 81 fL (ref 79–97)
MONOS ABS: 0.5 10*3/uL (ref 0.1–0.9)
Monocytes: 6 %
NEUTROS ABS: 5.6 10*3/uL (ref 1.4–7.0)
Neutrophils: 66 %
PLATELETS: 275 10*3/uL (ref 150–379)
RBC: 5.12 x10E6/uL (ref 3.77–5.28)
RDW: 13.7 % (ref 12.3–15.4)
WBC: 8.3 10*3/uL (ref 3.4–10.8)

## 2015-12-22 LAB — COMPREHENSIVE METABOLIC PANEL
A/G RATIO: 1.6 (ref 1.2–2.2)
ALT: 25 IU/L (ref 0–32)
AST: 21 IU/L (ref 0–40)
Albumin: 4.2 g/dL (ref 3.5–5.5)
Alkaline Phosphatase: 75 IU/L (ref 39–117)
BILIRUBIN TOTAL: 0.3 mg/dL (ref 0.0–1.2)
BUN/Creatinine Ratio: 20 (ref 9–23)
BUN: 14 mg/dL (ref 6–24)
CHLORIDE: 99 mmol/L (ref 96–106)
CO2: 27 mmol/L (ref 18–29)
Calcium: 8.8 mg/dL (ref 8.7–10.2)
Creatinine, Ser: 0.69 mg/dL (ref 0.57–1.00)
GFR calc Af Amer: 112 mL/min/{1.73_m2} (ref >59)
GFR calc non Af Amer: 97 mL/min/{1.73_m2} (ref >59)
Globulin, Total: 2.7 g/dL (ref 1.5–4.5)
Glucose: 133 mg/dL — ABNORMAL HIGH (ref 65–99)
POTASSIUM: 4 mmol/L (ref 3.5–5.2)
Sodium: 142 mmol/L (ref 134–144)
Total Protein: 6.9 g/dL (ref 6.0–8.5)

## 2015-12-22 LAB — TSH: TSH: 2.12 u[IU]/mL (ref 0.450–4.500)

## 2015-12-22 LAB — LIPID PANEL WITH LDL/HDL RATIO
CHOLESTEROL TOTAL: 140 mg/dL (ref 100–199)
HDL: 34 mg/dL — ABNORMAL LOW (ref >39)
LDL CALC: 76 mg/dL (ref 0–99)
LDl/HDL Ratio: 2.2 ratio units (ref 0.0–3.2)
Triglycerides: 151 mg/dL — ABNORMAL HIGH (ref 0–149)
VLDL Cholesterol Cal: 30 mg/dL (ref 5–40)

## 2016-01-27 ENCOUNTER — Other Ambulatory Visit: Payer: Self-pay | Admitting: Physician Assistant

## 2016-01-27 DIAGNOSIS — E119 Type 2 diabetes mellitus without complications: Secondary | ICD-10-CM

## 2016-01-29 ENCOUNTER — Ambulatory Visit (INDEPENDENT_AMBULATORY_CARE_PROVIDER_SITE_OTHER): Payer: 59 | Admitting: Family Medicine

## 2016-01-29 ENCOUNTER — Encounter: Payer: Self-pay | Admitting: Family Medicine

## 2016-01-29 VITALS — BP 110/60 | HR 88 | Temp 98.2°F | Resp 16 | Ht 62.0 in | Wt 150.0 lb

## 2016-01-29 DIAGNOSIS — Z0000 Encounter for general adult medical examination without abnormal findings: Secondary | ICD-10-CM | POA: Diagnosis not present

## 2016-01-29 DIAGNOSIS — E119 Type 2 diabetes mellitus without complications: Secondary | ICD-10-CM

## 2016-01-29 DIAGNOSIS — Z23 Encounter for immunization: Secondary | ICD-10-CM | POA: Diagnosis not present

## 2016-01-29 LAB — POCT URINALYSIS DIPSTICK
Bilirubin, UA: NEGATIVE
Blood, UA: NEGATIVE
Glucose, UA: NEGATIVE
Ketones, UA: NEGATIVE
Leukocytes, UA: NEGATIVE
Nitrite, UA: NEGATIVE
PH UA: 6
PROTEIN UA: NEGATIVE
SPEC GRAV UA: 1.025
UROBILINOGEN UA: NEGATIVE

## 2016-01-29 LAB — POCT UA - MICROALBUMIN: MICROALBUMIN (UR) POC: 20 mg/L

## 2016-01-29 NOTE — Progress Notes (Signed)
Patient: Andrea Soto, Female    DOB: [birth year], 55 y.o.   MRN: [id number] Visit Date: 01/29/2016  Today's Provider: [name], MD   Chief Complaint  Patient presents with  . Annual Exam   Subjective:  Andrea Soto is a 55 y.o. female who presents today for health maintenance and complete physical. She feels well. She reports exercising twice per week. She reports she is sleeping well.  09/21/2008 Colonoscopy-normal Mammogram, Pap and BMD per Gyn Immunization History  Administered Date(s) Administered  . Influenza,inj,Quad PF,36+ Mos 03/08/2015  . Pneumococcal Polysaccharide-23 04/08/2012  . Tdap 07/21/2007      Review of Systems  Constitutional: Negative.   HENT: Negative.   Eyes: Negative.   Respiratory: Negative.   Cardiovascular: Negative.   Gastrointestinal: Negative.   Endocrine: Negative.   Genitourinary: Negative.   Musculoskeletal: Negative.   Skin: Negative.   Allergic/Immunologic: Negative.   Neurological: Negative.   Hematological: Negative.   Psychiatric/Behavioral: Negative.     Social History   Social History  . Marital status: Married    Spouse name: [name]  . Number of children: 2  . Years of education: 16   Occupational History  . [employer]    Social History Main Topics  . Smoking status: Never Smoker  . Smokeless tobacco: Never Used  . Alcohol use 0.6 oz/week    1 Glasses of wine per week  . Drug use: No  . Sexual activity: Yes    Birth control/ protection: None   Other Topics Concern  . Not on file   Social History Narrative  . No narrative on file    Patient Active Problem List   Diagnosis Date Noted  . 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 Depression, major, single episode, mild ([place]) 10/14/2014  . Acid reflux 10/14/2014  . H/O transient cerebral ischemia 10/14/2014  . Hypercholesteremia 10/14/2014  . BP (high blood pressure) 10/14/2014  . 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 Diabetes mellitus, type 2 ([place]) 10/14/2014    Past  Surgical History:  Procedure Laterality Date  . BREAST BIOPSY  1978  . ENDOMETRIAL BIOPSY      Her family history includes Allergic rhinitis in her mother and son; Bone cancer in her paternal grandmother; Breast cancer in her paternal grandmother; Cancer in her father; Epilepsy in her daughter; Heart attack in her father; Hypertension in her mother and sister; Multiple myeloma in her father; Seizures in her daughter.    Outpatient Encounter Prescriptions as of 01/29/2016  Medication Sig Note  . aspirin 81 MG tablet Take by mouth. 10/14/2014: Received from: [facility]  . atorvastatin (LIPITOR) 40 MG tablet Take 1 tablet by mouth  daily   . Coenzyme Q10 (COQ10) 200 MG CAPS Take by mouth. 10/14/2014: Received from: [facility]  . hydrochlorothiazide (HYDRODIURIL) 25 MG tablet Take 1 tablet by mouth  daily   . lansoprazole (PREVACID) 30 MG capsule Take 30 mg by mouth daily at 12 noon.   . metFORMIN (GLUCOPHAGE) 1000 MG tablet Take 1 tablet (1,000 mg total) by mouth 2 (two) times daily with a meal.   . sertraline (ZOLOFT) 100 MG tablet Take 1 tablet by mouth  daily   . [DISCONTINUED] magnesium oxide (MAG-OX) 400 MG tablet Take 400 mg by mouth daily.   . [DISCONTINUED] omeprazole (PRILOSEC OTC) 20 MG tablet Take 20 mg by mouth daily.  10/14/2014: Received from: [facility]   No facility-administered encounter medications on file as of 01/29/2016.     Patient Care  Team: Jerrol Banana., MD as PCP - General (Family Medicine)     Objective:   Vitals:  Vitals:   01/29/16 0945  BP: 110/60  Pulse: 88  Resp: 16  Temp: 98.2 F (36.8 C)  TempSrc: Oral  Weight: 150 lb (68 kg)  Height: '5\' 2"'  (1.575 m)    Physical Exam  Constitutional: She is oriented to person, place, and time. She appears well-developed and well-nourished.  HENT:  Head: Normocephalic and atraumatic.  Right Ear: External ear normal.  Left Ear: External ear normal.   Nose: Nose normal.  Mouth/Throat: Oropharynx is clear and moist.  Eyes: Conjunctivae and EOM are normal. Pupils are equal, round, and reactive to light.  Neck: Normal range of motion. Neck supple.  Cardiovascular: Normal rate, normal heart sounds and intact distal pulses.   Pulmonary/Chest: Effort normal and breath sounds normal.  Abdominal: Soft. Bowel sounds are normal.  Musculoskeletal: Normal range of motion.  Neurological: She is alert and oriented to person, place, and time.  Skin: Skin is warm and dry.  Psychiatric: She has a normal mood and affect. Her behavior is normal. Judgment and thought content normal.     Depression Screen PHQ 2/9 Scores 01/29/2016  PHQ - 2 Score 0   Fall Risk  01/29/2016  Falls in the past year? No   Functional Status Survey: Is the patient deaf or have difficulty hearing?: No Does the patient have difficulty seeing, even when wearing glasses/contacts?: No Does the patient have difficulty concentrating, remembering, or making decisions?: No Does the patient have difficulty walking or climbing stairs?: No Does the patient have difficulty dressing or bathing?: No Does the patient have difficulty doing errands alone such as visiting a doctor's office or shopping?: No   Assessment & Plan:     Routine Health Maintenance and Physical Exam  Exercise Activities and Dietary recommendations Goals    None      Immunization History  Administered Date(s) Administered  . Influenza,inj,Quad PF,36+ Mos 03/08/2015  . Pneumococcal Polysaccharide-23 04/08/2012  . Tdap 07/21/2007    Health Maintenance  Topic Date Due  . HIV Screening  06/12/1973  . URINE MICROALBUMIN  05/03/2014  . INFLUENZA VACCINE  12/25/2015  . MAMMOGRAM  02/24/2016  . HEMOGLOBIN A1C  04/02/2016  . OPHTHALMOLOGY EXAM  07/11/2016  . FOOT EXAM  09/30/2016  . PAP SMEAR  02/23/2017  . PNEUMOCOCCAL POLYSACCHARIDE VACCINE (2) 04/08/2017  . TETANUS/TDAP  07/20/2017  . COLONOSCOPY   09/22/2018  . Hepatitis C Screening  Completed      Discussed health benefits of physical activity, and encouraged her to engage in regular exercise appropriate for her age and condition.    1. Annual physical exam  - CBC with Differential/Platelet - Comprehensive metabolic panel - Lipid Panel With LDL/HDL Ratio - TSH - POCT urinalysis dipstick  2. Type 2 diabetes mellitus without complication, unspecified long term insulin use status (HCC)  - Hemoglobin A1c - POCT UA - Microalbumin  3. Flu vaccine need  - Flu Vaccine QUAD 36+ mos PF IM (Fluarix & Fluzone Quad PF)  I have done the exam and reviewed the above chart and it is accurate to the best of my knowledge.  HPI, Exam and A&P Transcribed under the direction and in the presence of [name]., MD. Electronically Signed: Althea Charon, Susan Moore

## 2016-01-30 ENCOUNTER — Other Ambulatory Visit: Payer: Self-pay

## 2016-01-30 MED ORDER — ATORVASTATIN CALCIUM 40 MG PO TABS: 40.0000 mg | ORAL_TABLET | Freq: Every day | ORAL | 3 refills | Status: DC

## 2016-01-30 MED ORDER — SERTRALINE HCL 100 MG PO TABS: 100.0000 mg | ORAL_TABLET | Freq: Every day | ORAL | 3 refills | Status: DC

## 2016-01-30 MED ORDER — HYDROCHLOROTHIAZIDE 25 MG PO TABS: 25.0000 mg | ORAL_TABLET | Freq: Every day | ORAL | 3 refills | Status: DC

## 2016-06-03 ENCOUNTER — Ambulatory Visit (INDEPENDENT_AMBULATORY_CARE_PROVIDER_SITE_OTHER): Payer: 59 | Admitting: Family Medicine

## 2016-06-03 VITALS — BP 104/72 | HR 68 | Temp 98.4°F | Resp 12 | Wt 152.0 lb

## 2016-06-03 DIAGNOSIS — E119 Type 2 diabetes mellitus without complications: Secondary | ICD-10-CM

## 2016-06-03 DIAGNOSIS — E7800 Pure hypercholesterolemia, unspecified: Secondary | ICD-10-CM | POA: Diagnosis not present

## 2016-06-03 DIAGNOSIS — F320 Major depressive disorder, single episode, mild: Secondary | ICD-10-CM | POA: Diagnosis not present

## 2016-06-03 DIAGNOSIS — I10 Essential (primary) hypertension: Secondary | ICD-10-CM

## 2016-06-03 LAB — POCT GLYCOSYLATED HEMOGLOBIN (HGB A1C): Hemoglobin A1C: 6.5

## 2016-06-03 NOTE — Progress Notes (Signed)
Andrea Soto  MRN: [id number] DOB: Aug 05, 1960  Subjective:  HPI   Patient is here for follow up. Last office visit was 01/29/16 for physical. Routine labs were done on 12/21/15. Lab Results  Component Value Date   CHOL 140 12/21/2015   HDL 34 (L) 12/21/2015   LDLCALC 76 12/21/2015   TRIG 151 (H) 12/21/2015   Patient is checking her sugar about once a week and fasting readings have been around 130s. No hypoglycemic episodes and no numbness or tingling sensation present.  Lab Results  Component Value Date   HGBA1C 7.1 10/01/2015   Patient checks her b/p every 2 weeks and readings around 110s/70. No cardiac symptoms. BP Readings from Last 3 Encounters:  06/03/16 104/72  01/29/16 110/60  10/01/15 132/70   Depression is stable on sertraline. Doing well on this regimen.   Patient Active Problem List   Diagnosis Date Noted  . 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 Depression, major, single episode, mild ([place]) 10/14/2014  . Acid reflux 10/14/2014  . H/O transient cerebral ischemia 10/14/2014  . Hypercholesteremia 10/14/2014  . BP (high blood pressure) 10/14/2014  . 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 Diabetes mellitus, type 2 ([place]) 10/14/2014    Past Medical History:  Diagnosis Date  . Depression   . Hyperlipidemia     Social History   Social History  . Marital status: Married    Spouse name: [name]  . Number of children: 2  . Years of education: 16   Occupational History  . [employer]    Social History Main Topics  . Smoking status: Never Smoker  . Smokeless tobacco: Never Used  . Alcohol use 0.6 oz/week    1 Glasses of wine per week  . Drug use: No  . Sexual activity: Yes    Birth control/ protection: None   Other Topics Concern  . Not on file   Social History Narrative  . No narrative on file    Outpatient Encounter Prescriptions as of 06/03/2016  Medication Sig Note  . aspirin 81 MG tablet Take by mouth. 10/14/2014: Received from: [facility]  .  atorvastatin (LIPITOR) 40 MG tablet Take 1 tablet (40 mg total) by mouth daily.   . Coenzyme Q10 (COQ10) 200 MG CAPS Take by mouth. 10/14/2014: Received from: [facility]  . hydrochlorothiazide (HYDRODIURIL) 25 MG tablet Take 1 tablet (25 mg total) by mouth daily.   . lansoprazole (PREVACID) 30 MG capsule Take 30 mg by mouth daily at 12 noon.   . metFORMIN (GLUCOPHAGE) 1000 MG tablet Take 1 tablet by mouth two  times daily with meals   . sertraline (ZOLOFT) 100 MG tablet Take 1 tablet (100 mg total) by mouth daily.    No facility-administered encounter medications on file as of 06/03/2016.     No Known Allergies  Review of Systems  Constitutional: Negative.   Respiratory: Negative.   Cardiovascular: Negative.   Musculoskeletal: Negative.   Neurological: Negative.   Psychiatric/Behavioral: Negative.        Stable on the medication    Objective:  BP 104/72   Pulse 68   Temp 98.4 F (36.9 C)   Resp 12   Wt 152 lb (68.9 kg)   BMI 27.80 kg/m   Physical Exam  Constitutional: She is oriented to person, place, and time and well-developed, well-nourished, and in no distress.  HENT:  Head: Normocephalic and atraumatic.  Right Ear: External ear normal.  Left Ear: External ear normal.  Nose: Nose normal.  Eyes: Conjunctivae are normal. Pupils are equal, round, and reactive to light.  Neck: Normal range of motion. Neck supple.  Cardiovascular: Normal rate, regular rhythm, normal heart sounds and intact distal pulses.   No murmur heard. Pulmonary/Chest: Effort normal and breath sounds normal. No respiratory distress. She has no wheezes.  Abdominal: Soft. She exhibits no distension. There is no tenderness.  Musculoskeletal: She exhibits no edema or tenderness.  Neurological: She is alert and oriented to person, place, and time. Gait normal. GCS score is 15.  Skin: Skin is warm and dry.  Psychiatric: Mood, memory, affect and judgment normal.    Assessment and Plan :    1. Essential hypertension Stable.  2. Type 2 diabetes mellitus without complication, unspecified long term insulin use status (HCC) A1C 6.5. Better. Continue working on habits.RTC 4 months. - POCT HgB A1C  3. Depression, major, single episode, mild (HCC) Stable on current regimen.  4. Hypercholesteremia  HPI, Exam and A&P transcribed under direction and in the presence of Miguel Aschoff, MD. I have done the exam and reviewed the chart and it is accurate to the best of my knowledge. Development worker, community has been used and  any errors in dictation or transcription are unintentional. Miguel Aschoff M.D. [facility]

## 2016-07-24 DIAGNOSIS — J00 Acute nasopharyngitis [common cold]: Secondary | ICD-10-CM | POA: Diagnosis not present

## 2016-08-05 DIAGNOSIS — E119 Type 2 diabetes mellitus without complications: Secondary | ICD-10-CM | POA: Diagnosis not present

## 2016-08-07 LAB — HM DIABETES EYE EXAM

## 2016-09-05 ENCOUNTER — Encounter: Payer: Self-pay | Admitting: Physician Assistant

## 2016-09-05 ENCOUNTER — Ambulatory Visit (INDEPENDENT_AMBULATORY_CARE_PROVIDER_SITE_OTHER): Payer: 59 | Admitting: Physician Assistant

## 2016-09-05 VITALS — BP 106/74 | HR 88 | Temp 98.1°F | Resp 16 | Wt 153.0 lb

## 2016-09-05 DIAGNOSIS — J019 Acute sinusitis, unspecified: Secondary | ICD-10-CM

## 2016-09-05 MED ORDER — AMOXICILLIN-POT CLAVULANATE 875-125 MG PO TABS: 1.0000 | ORAL_TABLET | Freq: Two times a day (BID) | ORAL | 0 refills | Status: AC

## 2016-09-05 NOTE — Progress Notes (Signed)
[place]  Chief Complaint  Patient presents with  . Sinusitis    Started about two weeks ago.     Subjective:    Patient ID: Andrea Soto, female    DOB: July 02, 1960, 56 y.o.   MRN: [id number]  Upper Respiratory Infection: Andrea Soto is a 56 y.o. female with a past medical history significant for Type II DM complaining of symptoms of a URI, possible sinusitis. Symptoms include congestion, cough and sore throat. Onset of symptoms was 2 weeks ago, gradually worsening since that time. She also c/o congestion, headache described as "sinue headache" and nasal congestion for the past 2 weeks .  She is drinking plenty of fluids. Evaluation to date: none. Treatment to date: cough suppressants and decongestants. The treatment has provided minimal.   Review of Systems  Constitutional: Positive for fatigue. Negative for activity change, chills and diaphoresis.  HENT: Positive for congestion, postnasal drip, rhinorrhea, sinus pain, sinus pressure, sore throat and trouble swallowing. Negative for drooling, ear discharge, ear pain, facial swelling, hearing loss, nosebleeds, sneezing, tinnitus and voice change.   Eyes: Negative.   Respiratory: Negative.   Gastrointestinal: Positive for nausea. Negative for abdominal distention, abdominal pain, anal bleeding, blood in stool, constipation, diarrhea, rectal pain and vomiting.  Neurological: Positive for headaches. Negative for dizziness and light-headedness.       Objective:   BP 106/74 (BP Location: Left Arm, Patient Position: Sitting, Cuff Size: Normal)   Pulse 88   Temp 98.1 F (36.7 C) (Oral)   Resp 16   Wt 153 lb (69.4 kg)   BMI 27.98 kg/m   Patient Active Problem List   Diagnosis Date Noted  . 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 Depression, major, single episode, mild ([place]) 10/14/2014  . Acid reflux 10/14/2014  . H/O transient cerebral ischemia 10/14/2014  . Hypercholesteremia 10/14/2014  . BP  (high blood pressure) 10/14/2014  . 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 Diabetes mellitus, type 2 ([place]) 10/14/2014    Outpatient Encounter Prescriptions as of 09/05/2016  Medication Sig Note  . amoxicillin-clavulanate (AUGMENTIN) 875-125 MG tablet Take 1 tablet by mouth 2 (two) times daily.   Marland Kitchen aspirin 81 MG tablet Take by mouth. 10/14/2014: Received from: [facility]  . atorvastatin (LIPITOR) 40 MG tablet Take 1 tablet (40 mg total) by mouth daily.   . Coenzyme Q10 (COQ10) 200 MG CAPS Take by mouth. 10/14/2014: Received from: [facility]  . hydrochlorothiazide (HYDRODIURIL) 25 MG tablet Take 1 tablet (25 mg total) by mouth daily.   . lansoprazole (PREVACID) 30 MG capsule Take 30 mg by mouth daily at 12 noon.   . metFORMIN (GLUCOPHAGE) 1000 MG tablet Take 1 tablet by mouth two  times daily with meals   . sertraline (ZOLOFT) 100 MG tablet Take 1 tablet (100 mg total) by mouth daily.    No facility-administered encounter medications on file as of 09/05/2016.     No Known Allergies     Physical Exam  Constitutional: She is oriented to person, place, and time. She appears well-developed and well-nourished. No distress.  HENT:  Right Ear: External ear normal.  Left Ear: External ear normal.  Nose: Right sinus exhibits maxillary sinus tenderness and frontal sinus tenderness. Left sinus exhibits maxillary sinus tenderness and frontal sinus tenderness.  Mouth/Throat: Oropharynx is clear and moist. No oropharyngeal exudate, posterior oropharyngeal edema or posterior oropharyngeal erythema.  Tms opaque bilaterally   Eyes: Conjunctivae are normal. Right eye exhibits no discharge. Left eye exhibits  no discharge.  Neck: Neck supple.  Cardiovascular: Normal rate and regular rhythm.   Pulmonary/Chest: Effort normal and breath sounds normal. No respiratory distress. She has no wheezes. She has no rales.  Lymphadenopathy:    She has no cervical adenopathy.  Neurological: She  is alert and oriented to person, place, and time.  Skin: Skin is warm and dry. She is not diaphoretic.  Psychiatric: She has a normal mood and affect. Her behavior is normal.       Assessment & Plan:  1. Acute non-recurrent sinusitis, unspecified location  - amoxicillin-clavulanate (AUGMENTIN) 875-125 MG tablet; Take 1 tablet by mouth 2 (two) times daily.  Dispense: 20 tablet; Refill: 0  Return if symptoms worsen or fail to improve.    Patient Instructions  Sinusitis, Adult Sinusitis is soreness and inflammation of your sinuses. Sinuses are hollow spaces in the bones around your face. They are located:  Around your eyes.  In the middle of your forehead.  Behind your nose.  In your cheekbones. Your sinuses and nasal passages are lined with a stringy fluid (mucus). Mucus normally drains out of your sinuses. When your nasal tissues get inflamed or swollen, the mucus can get trapped or blocked so air cannot flow through your sinuses. This lets bacteria, viruses, and funguses grow, and that leads to infection. Follow these instructions at home: Medicines   Take, use, or apply over-the-counter and prescription medicines only as told by your doctor. These may include nasal sprays.  If you were prescribed an antibiotic medicine, take it as told by your doctor. Do not stop taking the antibiotic even if you start to feel better. Hydrate and Humidify   Drink enough water to keep your pee (urine) clear or pale yellow.  Use a cool mist humidifier to keep the humidity level in your home above 50%.  Breathe in steam for 10-15 minutes, 3-4 times a day or as told by your doctor. You can do this in the bathroom while a hot shower is running.  Try not to spend time in cool or dry air. Rest   Rest as much as possible.  Sleep with your head raised (elevated).  Make sure to get enough sleep each night. General instructions   Put a warm, moist washcloth on your face 3-4 times a day or as  told by your doctor. This will help with discomfort.  Wash your hands often with soap and water. If there is no soap and water, use hand sanitizer.  Do not smoke. Avoid being around people who are smoking (secondhand smoke).  Keep all follow-up visits as told by your doctor. This is important. Contact a doctor if:  You have a fever.  Your symptoms get worse.  Your symptoms do not get better within 10 days. Get help right away if:  You have a very bad headache.  You cannot stop throwing up (vomiting).  You have pain or swelling around your face or eyes.  You have trouble seeing.  You feel confused.  Your neck is stiff.  You have trouble breathing. This information is not intended to replace advice given to you by your health care provider. Make sure you discuss any questions you have with your health care provider. Document Released: 10/29/2007 Document Revised: 01/06/2016 Document Reviewed: 03/07/2015 Elsevier Interactive Patient Education  2017 [employer].     The entirety of the information documented in the History of Present Illness, Review of Systems and Physical Exam were personally obtained by me.  Portions of this information were initially documented by Ashley Royalty, CMA and reviewed by me for thoroughness and accuracy.

## 2016-09-05 NOTE — Patient Instructions (Signed)

## 2016-09-10 ENCOUNTER — Encounter: Payer: Self-pay | Admitting: Family Medicine

## 2016-09-10 ENCOUNTER — Ambulatory Visit (INDEPENDENT_AMBULATORY_CARE_PROVIDER_SITE_OTHER): Payer: 59 | Admitting: Family Medicine

## 2016-09-10 VITALS — BP 144/78 | HR 94 | Temp 97.9°F | Resp 16 | Wt 151.0 lb

## 2016-09-10 DIAGNOSIS — R197 Diarrhea, unspecified: Secondary | ICD-10-CM

## 2016-09-10 NOTE — Progress Notes (Signed)
Subjective:  HPI Pt is here for diarrhea she reports that she was started on Augmentin in Friday April 13 th for a sinus infection. She started having diarrhea the next day. She decided to stop the antibiotic on Monday and she is still having diarrhea. She has some abdominal pain in her mid abdomen. Describes it as cramping and gurgling. Denies fevers, chills, sweats, or body aches. She reports that the stool was watery but has gotten less watery. She has also had some nausea but no vomiting. She reports that she has taken Augmentin in the past but it has been a long time. Her sinus symptoms have much improved.   Prior to Admission medications   Medication Sig Start Date End Date Taking? Authorizing Provider  amoxicillin-clavulanate (AUGMENTIN) 875-125 MG tablet Take 1 tablet by mouth 2 (two) times daily. 09/05/16 09/15/16  Trinna Post, PA-C  aspirin 81 MG tablet Take by mouth.    Historical Provider, MD  atorvastatin (LIPITOR) 40 MG tablet Take 1 tablet (40 mg total) by mouth daily. 01/30/16   Eladia Frame Maceo Pro., MD  Coenzyme Q10 (COQ10) 200 MG CAPS Take by mouth. 04/02/11   Historical Provider, MD  hydrochlorothiazide (HYDRODIURIL) 25 MG tablet Take 1 tablet (25 mg total) by mouth daily. 01/30/16   Jerzee Jerome Maceo Pro., MD  lansoprazole (PREVACID) 30 MG capsule Take 30 mg by mouth daily at 12 noon.    Historical Provider, MD  metFORMIN (GLUCOPHAGE) 1000 MG tablet Take 1 tablet by mouth two  times daily with meals 01/29/16   Jerrol Banana., MD  sertraline (ZOLOFT) 100 MG tablet Take 1 tablet (100 mg total) by mouth daily. 01/30/16   Kewan Mcnease Maceo Pro., MD    Patient Active Problem List   Diagnosis Date Noted  . 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 Depression, major, single episode, mild ([place]) 10/14/2014  . Acid reflux 10/14/2014  . H/O transient cerebral ischemia 10/14/2014  . Hypercholesteremia 10/14/2014  . BP (high blood pressure) 10/14/2014  . 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  Diabetes mellitus, type 2 ([place]) 10/14/2014    Past Medical History:  Diagnosis Date  . Depression   . Hyperlipidemia     Social History   Social History  . Marital status: Married    Spouse name: [name]  . Number of children: 2  . Years of education: 16   Occupational History  . [employer]    Social History Main Topics  . Smoking status: Never Smoker  . Smokeless tobacco: Never Used  . Alcohol use 0.6 oz/week    1 Glasses of wine per week  . Drug use: No  . Sexual activity: Yes    Birth control/ protection: None   Other Topics Concern  . Not on file   Social History Narrative  . No narrative on file    No Known Allergies  Review of Systems  Constitutional: Positive for malaise/fatigue.  HENT: Negative.   Eyes: Negative.   Respiratory: Negative.   Cardiovascular: Negative.   Gastrointestinal: Positive for diarrhea and nausea.  Genitourinary: Negative.   Musculoskeletal: Negative.   Skin: Negative.   Neurological: Positive for weakness.  Endo/Heme/Allergies: Negative.   Psychiatric/Behavioral: Negative.     Immunization History  Administered Date(s) Administered  . Influenza,inj,Quad PF,36+ Mos 03/08/2015, 01/29/2016  . Pneumococcal Polysaccharide-23 04/08/2012  . Tdap 07/21/2007    Objective:  BP (!) 144/78 (BP Location: Left Arm, Patient Position: Sitting, Cuff Size: Normal)   Pulse 94  Temp 97.9 F (36.6 C) (Oral)   Resp 16   Wt 151 lb (68.5 kg)   SpO2 97%   BMI 27.62 kg/m   Physical Exam  Constitutional: She is oriented to person, place, and time and well-developed, well-nourished, and in no distress.  Eyes: Conjunctivae and EOM are normal. Pupils are equal, round, and reactive to light.  Neck: Normal range of motion. Neck supple.  Cardiovascular: Normal rate, regular rhythm, normal heart sounds and intact distal pulses.   Pulmonary/Chest: Effort normal and breath sounds normal.  Abdominal: Soft. Bowel sounds are normal.  Musculoskeletal:  Normal range of motion.  Neurological: She is alert and oriented to person, place, and time. She has normal reflexes. Gait normal. GCS score is 15.  Skin: Skin is warm and dry.  Psychiatric: Mood, memory, affect and judgment normal.    Lab Results  Component Value Date   WBC 8.3 12/21/2015   HGB 13.8 11/13/2014   HCT 41.5 12/21/2015   PLT 275 12/21/2015   GLUCOSE 133 (H) 12/21/2015   CHOL 140 12/21/2015   TRIG 151 (H) 12/21/2015   HDL 34 (L) 12/21/2015   LDLCALC 76 12/21/2015   TSH 2.120 12/21/2015   HGBA1C 6.5 06/03/2016   MICROALBUR 20 01/29/2016    CMP     Component Value Date/Time   NA 142 12/21/2015 0811   K 4.0 12/21/2015 0811   CL 99 12/21/2015 0811   CO2 27 12/21/2015 0811   GLUCOSE 133 (H) 12/21/2015 0811   GLUCOSE 175 (H) 11/13/2014 1224   BUN 14 12/21/2015 0811   CREATININE 0.69 12/21/2015 0811   CALCIUM 8.8 12/21/2015 0811   PROT 6.9 12/21/2015 0811   ALBUMIN 4.2 12/21/2015 0811   AST 21 12/21/2015 0811   ALT 25 12/21/2015 0811   ALKPHOS 75 12/21/2015 0811   BILITOT 0.3 12/21/2015 0811   GFRNONAA 97 12/21/2015 0811   GFRAA 112 12/21/2015 0811    Assessment and Plan :  1. Diarrhea, unspecified type probably due to antibiotic. Questionable colitis. No abdominal tenderness on exam. Recommended take imodium. Will get stool samples and blood work if she does improve. (CBC, MetC, stool studies) 2.TIIDM  HPI, Exam, and A&P Transcribed under the direction and in the presence of Angella Montas L. Cranford Mon, MD  Electronically Signed: Katina Dung, CMA I have done the exam and reviewed the above chart and it is accurate to the best of my knowledge. Development worker, community has been used in this note in any air is in the dictation or transcription are unintentional.  [facility] 09/10/2016 3:34 PM

## 2016-09-10 NOTE — Patient Instructions (Addendum)
Take imodium as directed.   Bland Diet A bland diet consists of foods that do not have a lot of fat or fiber. Foods without fat or fiber are easier for the body to digest. They are also less likely to irritate your mouth, throat, stomach, and other parts of your gastrointestinal tract. A bland diet is sometimes called a BRAT diet. What is my plan? Your health care provider or dietitian may recommend specific changes to your diet to prevent and treat your symptoms, such as:  Eating small meals often.  Cooking food until it is soft enough to chew easily.  Chewing your food well.  Drinking fluids slowly.  Not eating foods that are very spicy, sour, or fatty.  Not eating citrus fruits, such as oranges and grapefruit. What do I need to know about this diet?  Eat a variety of foods from the bland diet food list.  Do not follow a bland diet longer than you have to.  Ask your health care provider whether you should take vitamins. What foods can I eat? Grains   Hot cereals, such as cream of wheat. Bread, crackers, or tortillas made from refined white flour. Rice. Vegetables  Canned or cooked vegetables. Mashed or boiled potatoes. Fruits  Bananas. Applesauce. Other types of cooked or canned fruit with the skin and seeds removed, such as canned peaches or pears. Meats and Other Protein Sources  Scrambled eggs. Creamy peanut butter or other nut butters. Lean, well-cooked meats, such as chicken or fish. Tofu. Soups or broths. Dairy  Low-fat dairy products, such as milk, cottage cheese, or yogurt. Beverages  Water. Herbal tea. Apple juice. Sweets and Desserts  Pudding. Custard. Fruit gelatin. Ice cream. Fats and Oils  Mild salad dressings. Canola or olive oil. The items listed above may not be a complete list of allowed foods or beverages. Contact your dietitian for more options.  What foods are not recommended? Foods and ingredients that are often not recommended include:  Spicy  foods, such as hot sauce or salsa.  Fried foods.  Sour foods, such as pickled or fermented foods.  Raw vegetables or fruits, especially citrus or berries.  Caffeinated drinks.  Alcohol.  Strongly flavored seasonings or condiments. The items listed above may not be a complete list of foods and beverages that are not allowed. Contact your dietitian for more information.  This information is not intended to replace advice given to you by your health care provider. Make sure you discuss any questions you have with your health care provider. Document Released: 09/03/2015 Document Revised: 10/18/2015 Document Reviewed: 05/24/2014 Elsevier Interactive Patient Education  2017 [employer].

## 2016-10-02 ENCOUNTER — Ambulatory Visit (INDEPENDENT_AMBULATORY_CARE_PROVIDER_SITE_OTHER): Payer: 59 | Admitting: Family Medicine

## 2016-10-02 ENCOUNTER — Encounter: Payer: Self-pay | Admitting: Family Medicine

## 2016-10-02 VITALS — BP 106/62 | HR 84 | Resp 16 | Wt 153.0 lb

## 2016-10-02 DIAGNOSIS — E119 Type 2 diabetes mellitus without complications: Secondary | ICD-10-CM

## 2016-10-02 DIAGNOSIS — R5383 Other fatigue: Secondary | ICD-10-CM

## 2016-10-02 DIAGNOSIS — E7800 Pure hypercholesterolemia, unspecified: Secondary | ICD-10-CM

## 2016-10-02 DIAGNOSIS — I10 Essential (primary) hypertension: Secondary | ICD-10-CM | POA: Diagnosis not present

## 2016-10-02 LAB — POCT GLYCOSYLATED HEMOGLOBIN (HGB A1C)
Est. average glucose Bld gHb Est-mCnc: 151
Hemoglobin A1C: 6.9

## 2016-10-02 NOTE — Progress Notes (Signed)
Patient: Andrea Soto Female    DOB: August 24, 1960   56 y.o.   MRN: [id number] Visit Date: 10/02/2016  Today's Provider: [name], MD   Chief Complaint  Patient presents with  . Diabetes   Subjective:    HPI  Diabetes Mellitus Type II, Follow-up:   Lab Results  Component Value Date   HGBA1C 6.9 10/02/2016   HGBA1C 6.5 06/03/2016   HGBA1C 7.1 10/01/2015    Last seen for diabetes 2 months ago.  Management since then includes no changes. She reports good compliance with treatment. She is not having side effects.  Current symptoms include none and have been stable. Home blood sugar records: trend: stable  Episodes of hypoglycemia? no   Current Insulin Regimen: none Most Recent Eye Exam: due Weight trend: stable Prior visit with dietician: no Current diet: well balanced Current exercise: none  Pertinent Labs:    Component Value Date/Time   CHOL 140 12/21/2015 0811   TRIG 151 (H) 12/21/2015 0811   HDL 34 (L) 12/21/2015 0811   LDLCALC 76 12/21/2015 0811   CREATININE 0.69 12/21/2015 0811    Wt Readings from Last 3 Encounters:  10/02/16 153 lb (69.4 kg)  09/10/16 151 lb (68.5 kg)  09/05/16 153 lb (69.4 kg)      Patient also mentions that she still does not feel well. She was seen twice in the office about 1 month ago with diarrhea symptoms and sinus issues. She reports that she has still not gotten her strength from being ill.     No Known Allergies   Current Outpatient Prescriptions:  .  aspirin 81 MG tablet, Take by mouth., Disp: , Rfl:  .  atorvastatin (LIPITOR) 40 MG tablet, Take 1 tablet (40 mg total) by mouth daily., Disp: 90 tablet, Rfl: 3 .  Coenzyme Q10 (COQ10) 200 MG CAPS, Take by mouth., Disp: , Rfl:  .  hydrochlorothiazide (HYDRODIURIL) 25 MG tablet, Take 1 tablet (25 mg total) by mouth daily., Disp: 90 tablet, Rfl: 3 .  lansoprazole (PREVACID) 30 MG capsule, Take 30 mg by mouth daily at 12 noon., Disp: , Rfl:  .  metFORMIN  (GLUCOPHAGE) 1000 MG tablet, Take 1 tablet by mouth two  times daily with meals, Disp: 180 tablet, Rfl: 3 .  sertraline (ZOLOFT) 100 MG tablet, Take 1 tablet (100 mg total) by mouth daily., Disp: 90 tablet, Rfl: 3  Review of Systems  Constitutional: Positive for activity change and fatigue.  HENT: Positive for congestion.   Respiratory: Positive for cough.   Cardiovascular: Negative for chest pain, palpitations and leg swelling.  Gastrointestinal: Negative for abdominal pain, diarrhea and nausea.  Endocrine: Negative.   Genitourinary: Negative.   Musculoskeletal: Negative.   Neurological: Negative.     Social History  Substance Use Topics  . Smoking status: Never Smoker  . Smokeless tobacco: Never Used  . Alcohol use 0.6 oz/week    1 Glasses of wine per week   Objective:   BP 106/62 (BP Location: Right Arm, Patient Position: Sitting, Cuff Size: Normal)   Pulse 84   Resp 16   Wt 153 lb (69.4 kg)   SpO2 98%   BMI 27.98 kg/m  Vitals:   10/02/16 0805  BP: 106/62  Pulse: 84  Resp: 16  SpO2: 98%  Weight: 153 lb (69.4 kg)     Physical Exam  Constitutional: She is oriented to person, place, and time. She appears well-developed and well-nourished.  HENT:  Head:  Normocephalic and atraumatic.  Right Ear: External ear normal.  Left Ear: External ear normal.  Nose: Nose normal.  Mouth/Throat: Oropharynx is clear and moist.  Neck: Normal range of motion. Neck supple.  Cardiovascular: Normal rate, regular rhythm and normal heart sounds.   Pulmonary/Chest: Effort normal and breath sounds normal.  Abdominal: Soft. Bowel sounds are normal.  Neurological: She is alert and oriented to person, place, and time.  Skin: Skin is warm and dry.  Psychiatric: She has a normal mood and affect. Her behavior is normal. Judgment and thought content normal.        Assessment & Plan:     1. Type 2 diabetes mellitus without complication, unspecified whether long term insulin use  ([place]) Continue to work on diet and exercise. - POCT glycosylated hemoglobin (Hb A1C)--6.9 today.  2. Essential hypertension  - Comprehensive metabolic panel  3. Fatigue, unspecified type  - CBC with Differential/Platelet - TSH  4. Hypercholesteremia  - Lipid panel       I have done the exam and reviewed the above chart and it is accurate to the best of my knowledge. Development worker, community has been used in this note in any air is in the dictation or transcription are unintentional.  [name], MD  [place]

## 2016-11-05 ENCOUNTER — Ambulatory Visit (INDEPENDENT_AMBULATORY_CARE_PROVIDER_SITE_OTHER): Payer: 59 | Admitting: Physician Assistant

## 2016-11-05 ENCOUNTER — Encounter: Payer: Self-pay | Admitting: Physician Assistant

## 2016-11-05 VITALS — BP 118/70 | HR 88 | Temp 98.2°F | Wt 152.8 lb

## 2016-11-05 DIAGNOSIS — N3000 Acute cystitis without hematuria: Secondary | ICD-10-CM | POA: Diagnosis not present

## 2016-11-05 DIAGNOSIS — R350 Frequency of micturition: Secondary | ICD-10-CM | POA: Diagnosis not present

## 2016-11-05 LAB — POCT URINALYSIS DIPSTICK
Bilirubin, UA: NEGATIVE
Blood, UA: NEGATIVE
GLUCOSE UA: NEGATIVE
Ketones, UA: NEGATIVE
NITRITE UA: NEGATIVE
PROTEIN UA: NEGATIVE
Spec Grav, UA: 1.01 (ref 1.010–1.025)
UROBILINOGEN UA: 0.2 U/dL
pH, UA: 6 (ref 5.0–8.0)

## 2016-11-05 MED ORDER — SULFAMETHOXAZOLE-TRIMETHOPRIM 800-160 MG PO TABS: 1.0000 | ORAL_TABLET | Freq: Two times a day (BID) | ORAL | 0 refills | Status: DC

## 2016-11-05 NOTE — Patient Instructions (Signed)

## 2016-11-05 NOTE — Progress Notes (Signed)
Patient: Andrea Soto Female    DOB: 06-22-1960   56 y.o.   MRN: [id number] Visit Date: 11/05/2016  Today's Provider: Mar Daring, PA-C   Chief Complaint  Patient presents with  . Urinary Tract Infection   Subjective:    Urinary Tract Infection   This is a new problem. Episode onset: Monday. The problem occurs every urination. The problem has been unchanged. The patient is experiencing no pain. There has been no fever. Associated symptoms include frequency. Pertinent negatives include no flank pain or urgency. Associated symptoms comments: Lower back pain, foul odor urine, and nocturia . She has tried increased fluids (Cranberry Juice) for the symptoms. The treatment provided no relief.     Previous Medications   ASPIRIN 81 MG TABLET    Take by mouth.   ATORVASTATIN (LIPITOR) 40 MG TABLET    Take 1 tablet (40 mg total) by mouth daily.   COENZYME Q10 (COQ10) 200 MG CAPS    Take by mouth.   HYDROCHLOROTHIAZIDE (HYDRODIURIL) 25 MG TABLET    Take 1 tablet (25 mg total) by mouth daily.   LANSOPRAZOLE (PREVACID) 30 MG CAPSULE    Take 30 mg by mouth daily at 12 noon.   METFORMIN (GLUCOPHAGE) 1000 MG TABLET    Take 1 tablet by mouth two  times daily with meals   SERTRALINE (ZOLOFT) 100 MG TABLET    Take 1 tablet (100 mg total) by mouth daily.    Review of Systems  Constitutional: Negative.   Respiratory: Negative.   Cardiovascular: Negative.   Genitourinary: Positive for frequency. Negative for dysuria, flank pain and urgency.       Foul odor, increased nocturia  Musculoskeletal: Positive for back pain.    Social History  Substance Use Topics  . Smoking status: Never Smoker  . Smokeless tobacco: Never Used  . Alcohol use 0.6 oz/week    1 Glasses of wine per week   Objective:   BP 118/70 (BP Location: Right Arm, Patient Position: Sitting, Cuff Size: Normal)   Pulse 88   Temp 98.2 F (36.8 C) (Oral)   Wt 152 lb 12.8 oz (69.3 kg)   SpO2 96%   BMI 27.95 kg/m   Physical  Exam  Constitutional: She is oriented to person, place, and time. She appears well-developed and well-nourished. No distress.  Cardiovascular: Normal rate, regular rhythm and normal heart sounds.  Exam reveals no gallop and no friction rub.   No murmur heard. Pulmonary/Chest: Effort normal and breath sounds normal. No respiratory distress. She has no wheezes. She has no rales.  Abdominal: Soft. Normal appearance and bowel sounds are normal. She exhibits no distension and no mass. There is no hepatosplenomegaly. There is tenderness in the suprapubic area. There is no rebound, no guarding and no CVA tenderness.  Suprapubic pressure not pain  Neurological: She is alert and oriented to person, place, and time.  Skin: Skin is warm and dry. She is not diaphoretic.  Vitals reviewed.     Assessment & Plan:     1. Acute cystitis without hematuria Worsening symptoms. UA positive. Will treat empirically with Bactrim as below. . Continue to push fluids. Urine sent for culture. Will follow up pending C&S results. She is to call if symptoms do not improve or if they worsen.  - sulfamethoxazole-trimethoprim (BACTRIM DS,SEPTRA DS) 800-160 MG tablet; Take 1 tablet by mouth 2 (two) times daily.  Dispense: 14 tablet; Refill: 0 - Urine culture  2. Urinary frequency UA positive. See  above.  - POCT Urinalysis Dipstick   Follow up: No Follow-up on file.

## 2016-11-07 ENCOUNTER — Telehealth: Payer: Self-pay

## 2016-11-07 LAB — URINE CULTURE

## 2016-11-07 MED ORDER — NITROFURANTOIN MONOHYD MACRO 100 MG PO CAPS: 100.0000 mg | ORAL_CAPSULE | Freq: Two times a day (BID) | ORAL | 0 refills | Status: DC

## 2016-11-07 NOTE — Addendum Note (Signed)
Addended by: Mar Daring on: 11/07/2016 11:45 AM   Modules accepted: Orders

## 2016-11-07 NOTE — Telephone Encounter (Signed)
-----   Message from Mar Daring, [place] sent at 11/07/2016 11:44 AM EDT ----- Urine culture is positive for e.coli but is resistant to bactrim. Will send in [place] for you to pharmacy on file.

## 2016-11-07 NOTE — Telephone Encounter (Signed)
Patient advised as below. Patient verbalizes understanding and is in agreement with treatment plan.  

## 2016-11-12 DIAGNOSIS — D485 Neoplasm of uncertain behavior of skin: Secondary | ICD-10-CM | POA: Diagnosis not present

## 2016-11-12 DIAGNOSIS — Z86018 Personal history of other benign neoplasm: Secondary | ICD-10-CM | POA: Diagnosis not present

## 2016-12-04 DIAGNOSIS — D229 Melanocytic nevi, unspecified: Secondary | ICD-10-CM | POA: Diagnosis not present

## 2016-12-04 DIAGNOSIS — D485 Neoplasm of uncertain behavior of skin: Secondary | ICD-10-CM | POA: Diagnosis not present

## 2016-12-20 ENCOUNTER — Other Ambulatory Visit: Payer: Self-pay | Admitting: Family Medicine

## 2016-12-20 DIAGNOSIS — E119 Type 2 diabetes mellitus without complications: Secondary | ICD-10-CM

## 2016-12-25 DIAGNOSIS — R5383 Other fatigue: Secondary | ICD-10-CM | POA: Diagnosis not present

## 2016-12-25 DIAGNOSIS — I10 Essential (primary) hypertension: Secondary | ICD-10-CM | POA: Diagnosis not present

## 2016-12-25 DIAGNOSIS — E7800 Pure hypercholesterolemia, unspecified: Secondary | ICD-10-CM | POA: Diagnosis not present

## 2016-12-26 LAB — CBC WITH DIFFERENTIAL/PLATELET
Basophils Absolute: 0 10*3/uL (ref 0.0–0.2)
Basos: 0 %
EOS (ABSOLUTE): 0.2 10*3/uL (ref 0.0–0.4)
EOS: 3 %
Hematocrit: 42.2 % (ref 34.0–46.6)
Hemoglobin: 13.7 g/dL (ref 11.1–15.9)
Immature Grans (Abs): 0 10*3/uL (ref 0.0–0.1)
Immature Granulocytes: 0 %
LYMPHS ABS: 2.3 10*3/uL (ref 0.7–3.1)
Lymphs: 26 %
MCH: 26.8 pg (ref 26.6–33.0)
MCHC: 32.5 g/dL (ref 31.5–35.7)
MCV: 83 fL (ref 79–97)
MONOS ABS: 0.6 10*3/uL (ref 0.1–0.9)
Monocytes: 6 %
NEUTROS ABS: 5.9 10*3/uL (ref 1.4–7.0)
Neutrophils: 65 %
Platelets: 285 10*3/uL (ref 150–379)
RBC: 5.11 x10E6/uL (ref 3.77–5.28)
RDW: 14.1 % (ref 12.3–15.4)
WBC: 9 10*3/uL (ref 3.4–10.8)

## 2016-12-26 LAB — COMPREHENSIVE METABOLIC PANEL
ALBUMIN: 4.6 g/dL (ref 3.5–5.5)
ALK PHOS: 71 IU/L (ref 39–117)
ALT: 33 IU/L — ABNORMAL HIGH (ref 0–32)
AST: 26 IU/L (ref 0–40)
Albumin/Globulin Ratio: 2 (ref 1.2–2.2)
BILIRUBIN TOTAL: 0.3 mg/dL (ref 0.0–1.2)
BUN / CREAT RATIO: 21 (ref 9–23)
BUN: 15 mg/dL (ref 6–24)
CO2: 26 mmol/L (ref 20–29)
CREATININE: 0.73 mg/dL (ref 0.57–1.00)
Calcium: 9.5 mg/dL (ref 8.7–10.2)
Chloride: 96 mmol/L (ref 96–106)
GFR calc Af Amer: 105 mL/min/{1.73_m2} (ref >59)
GFR calc non Af Amer: 91 mL/min/{1.73_m2} (ref >59)
GLOBULIN, TOTAL: 2.3 g/dL (ref 1.5–4.5)
GLUCOSE: 111 mg/dL — AB (ref 65–99)
Potassium: 3.9 mmol/L (ref 3.5–5.2)
SODIUM: 141 mmol/L (ref 134–144)
Total Protein: 6.9 g/dL (ref 6.0–8.5)

## 2016-12-26 LAB — LIPID PANEL
CHOL/HDL RATIO: 4.1 ratio (ref 0.0–4.4)
Cholesterol, Total: 131 mg/dL (ref 100–199)
HDL: 32 mg/dL — ABNORMAL LOW (ref >39)
LDL Calculated: 73 mg/dL (ref 0–99)
TRIGLYCERIDES: 128 mg/dL (ref 0–149)
VLDL Cholesterol Cal: 26 mg/dL (ref 5–40)

## 2016-12-26 LAB — TSH: TSH: 2.45 u[IU]/mL (ref 0.450–4.500)

## 2016-12-30 ENCOUNTER — Other Ambulatory Visit: Payer: Self-pay | Admitting: Family Medicine

## 2016-12-30 ENCOUNTER — Telehealth: Payer: Self-pay

## 2016-12-30 NOTE — Telephone Encounter (Signed)
[email or web address]

## 2016-12-30 NOTE — Telephone Encounter (Signed)
-----   Message from Jerrol Banana., MD sent at 12/26/2016  7:29 AM EDT ----- Labs good.

## 2017-02-04 ENCOUNTER — Other Ambulatory Visit: Payer: Self-pay | Admitting: Family Medicine

## 2017-02-09 ENCOUNTER — Encounter: Payer: Self-pay | Admitting: Family Medicine

## 2017-02-09 ENCOUNTER — Ambulatory Visit (INDEPENDENT_AMBULATORY_CARE_PROVIDER_SITE_OTHER): Payer: 59 | Admitting: Family Medicine

## 2017-02-09 VITALS — BP 108/64 | HR 90 | Temp 98.6°F | Resp 16 | Wt 152.0 lb

## 2017-02-09 DIAGNOSIS — Z23 Encounter for immunization: Secondary | ICD-10-CM | POA: Diagnosis not present

## 2017-02-09 DIAGNOSIS — I10 Essential (primary) hypertension: Secondary | ICD-10-CM | POA: Diagnosis not present

## 2017-02-09 DIAGNOSIS — E119 Type 2 diabetes mellitus without complications: Secondary | ICD-10-CM | POA: Diagnosis not present

## 2017-02-09 LAB — POCT UA - MICROALBUMIN: Microalbumin Ur, POC: NEGATIVE mg/L

## 2017-02-09 LAB — POCT GLYCOSYLATED HEMOGLOBIN (HGB A1C): HEMOGLOBIN A1C: 6.7

## 2017-02-09 NOTE — Progress Notes (Signed)
Patient: Andrea Soto Female    DOB: 06/21/1960   56 y.o.   MRN: [id number] Visit Date: 02/09/2017  Today's Provider: [name], MD   Chief Complaint  Patient presents with  . Diabetes  . Hypertension   Subjective:    HPI  Diabetes Mellitus Type II, Follow-up:   Lab Results  Component Value Date   HGBA1C 6.7 02/09/2017   HGBA1C 6.9 10/02/2016   HGBA1C 6.5 06/03/2016    Last seen for diabetes 4 months ago.  Management since then includes none. She reports good compliance with treatment. She is not having side effects.  Home blood sugar records: 130's  Episodes of hypoglycemia? no   Current Insulin Regimen: n/a Most Recent Eye Exam: 06/2016 Current exercise: none  Pertinent Labs:    Component Value Date/Time   CHOL 131 12/25/2016 0832   TRIG 128 12/25/2016 0832   HDL 32 (L) 12/25/2016 0832   LDLCALC 73 12/25/2016 0832   CREATININE 0.73 12/25/2016 0832    Wt Readings from Last 3 Encounters:  02/09/17 152 lb (68.9 kg)  11/05/16 152 lb 12.8 oz (69.3 kg)  10/02/16 153 lb (69.4 kg)    ------------------------------------------------------------------------  Hypertension, follow-up:  BP Readings from Last 3 Encounters:  02/09/17 108/64  11/05/16 118/70  10/02/16 106/62    She was last seen for hypertension 4 months ago.  BP at that visit was 106/62. Management since that visit includes none. She reports good compliance with treatment. She is not having side effects.  She is not exercising. Outside blood pressures are not checking. Patient denies chest pain, chest pressure/discomfort, claudication, dyspnea, exertional chest pressure/discomfort, fatigue, irregular heart beat, lower extremity edema, near-syncope, orthopnea, palpitations, paroxysmal nocturnal dyspnea, syncope and tachypnea.    Wt Readings from Last 3 Encounters:  02/09/17 152 lb (68.9 kg)  11/05/16 152 lb 12.8 oz (69.3 kg)  10/02/16 153 lb (69.4 kg)    ------------------------------------------------------------------------    Allergies  Allergen Reactions  . Augmentin [Amoxicillin-Pot Clavulanate] Diarrhea     Current Outpatient Prescriptions:  .  aspirin 81 MG tablet, Take by mouth., Disp: , Rfl:  .  atorvastatin (LIPITOR) 40 MG tablet, TAKE 1 TABLET BY MOUTH  DAILY, Disp: 90 tablet, Rfl: 3 .  Coenzyme Q10 (COQ10) 200 MG CAPS, Take by mouth., Disp: , Rfl:  .  hydrochlorothiazide (HYDRODIURIL) 25 MG tablet, TAKE 1 TABLET BY MOUTH  DAILY, Disp: 90 tablet, Rfl: 3 .  lansoprazole (PREVACID) 30 MG capsule, Take 30 mg by mouth daily at 12 noon., Disp: , Rfl:  .  metFORMIN (GLUCOPHAGE) 1000 MG tablet, TAKE 1 TABLET BY MOUTH TWO  TIMES DAILY WITH MEALS, Disp: 180 tablet, Rfl: 3 .  sertraline (ZOLOFT) 100 MG tablet, TAKE 1 TABLET BY MOUTH  DAILY, Disp: 90 tablet, Rfl: 3  Review of Systems  Constitutional: Negative.   HENT: Negative.   Eyes: Negative.   Respiratory: Negative.   Cardiovascular: Negative.   Gastrointestinal: Negative.   Endocrine: Negative.   Genitourinary: Negative.   Musculoskeletal: Negative.   Skin: Negative.   Allergic/Immunologic: Negative.   Neurological: Negative.   Hematological: Negative.   Psychiatric/Behavioral: Negative.     Social History  Substance Use Topics  . Smoking status: Never Smoker  . Smokeless tobacco: Never Used  . Alcohol use 0.6 oz/week    1 Glasses of wine per week   Objective:   BP 108/64 (BP Location: Left Arm, Patient Position: Sitting, Cuff Size: Normal)   Pulse  90   Temp 98.6 F (37 C) (Oral)   Resp 16   Wt 152 lb (68.9 kg)   BMI 27.80 kg/m  Vitals:   02/09/17 0817  BP: 108/64  Pulse: 90  Resp: 16  Temp: 98.6 F (37 C)  TempSrc: Oral  Weight: 152 lb (68.9 kg)     Physical Exam  Constitutional: She is oriented to person, place, and time. She appears well-developed and well-nourished.  Eyes: Pupils are equal, round, and reactive to light. Conjunctivae and  EOM are normal.  Neck: Normal range of motion. Neck supple.  Cardiovascular: Normal rate, regular rhythm, normal heart sounds and intact distal pulses.   Pulmonary/Chest: Effort normal and breath sounds normal.  Musculoskeletal: Normal range of motion.  Neurological: She is alert and oriented to person, place, and time. She has normal reflexes.  Skin: Skin is warm and dry.  Psychiatric: She has a normal mood and affect. Her behavior is normal. Judgment and thought content normal.   Diabetic Foot Exam - Simple   Simple Foot Form Diabetic Foot exam was performed with the following findings:  Yes 02/09/2017  8:38 AM  Visual Inspection No deformities, no ulcerations, no other skin breakdown bilaterally:  Yes Sensation Testing Intact to touch and monofilament testing bilaterally:  Yes Pulse Check Posterior Tibialis and Dorsalis pulse intact bilaterally:  Yes Comments        Assessment & Plan:     1. Type 2 diabetes mellitus without complication, unspecified whether long term insulin use ([place]) Foot exam normal today. - POCT HgB A1C 6.7 stable. Follow up in 4 month - POCT UA - Microalbumin  2. Essential hypertension Stable.   3. Need for influenza vaccination  - Flu Vaccine QUAD 36+ mos IM 4. Hyperlipidemia Treated due to diabetes    HPI, Exam, and A&P Transcribed under the direction and in the presence of Devin Ganaway L. Cranford Mon, MD  Electronically Signed: Katina Dung, CMA  I have done the exam and reviewed the above chart and it is accurate to the best of my knowledge. Development worker, community has been used in this note in any air is in the dictation or transcription are unintentional.  [name], MD  [place]

## 2017-03-18 ENCOUNTER — Encounter: Payer: Self-pay | Admitting: Family Medicine

## 2017-04-13 ENCOUNTER — Other Ambulatory Visit: Payer: Self-pay

## 2017-04-13 DIAGNOSIS — E119 Type 2 diabetes mellitus without complications: Secondary | ICD-10-CM

## 2017-04-13 MED ORDER — MELOXICAM 7.5 MG PO TABS: 7.5000 mg | ORAL_TABLET | Freq: Every day | ORAL | 0 refills | Status: DC

## 2017-04-13 MED ORDER — SERTRALINE HCL 100 MG PO TABS: 100.0000 mg | ORAL_TABLET | Freq: Every day | ORAL | 3 refills | Status: DC

## 2017-04-13 MED ORDER — HYDROCHLOROTHIAZIDE 25 MG PO TABS: 25.0000 mg | ORAL_TABLET | Freq: Every day | ORAL | 3 refills | Status: DC

## 2017-04-13 MED ORDER — ATORVASTATIN CALCIUM 40 MG PO TABS: 40.0000 mg | ORAL_TABLET | Freq: Every day | ORAL | 3 refills | Status: DC

## 2017-04-13 MED ORDER — METFORMIN HCL 1000 MG PO TABS: 1000.0000 mg | ORAL_TABLET | Freq: Two times a day (BID) | ORAL | 3 refills | Status: DC

## 2017-04-23 ENCOUNTER — Ambulatory Visit (INDEPENDENT_AMBULATORY_CARE_PROVIDER_SITE_OTHER): Payer: 59 | Admitting: Family Medicine

## 2017-04-23 ENCOUNTER — Encounter: Payer: Self-pay | Admitting: Physician Assistant

## 2017-04-23 ENCOUNTER — Ambulatory Visit: Payer: 59 | Admitting: Physician Assistant

## 2017-04-23 ENCOUNTER — Encounter: Payer: Self-pay | Admitting: Family Medicine

## 2017-04-23 VITALS — BP 158/92 | HR 78 | Temp 97.5°F

## 2017-04-23 DIAGNOSIS — E119 Type 2 diabetes mellitus without complications: Secondary | ICD-10-CM

## 2017-04-23 DIAGNOSIS — R55 Syncope and collapse: Secondary | ICD-10-CM

## 2017-04-23 DIAGNOSIS — E860 Dehydration: Secondary | ICD-10-CM

## 2017-04-23 LAB — GLUCOSE, POCT (MANUAL RESULT ENTRY): POC Glucose: 154 mg/dl — AB (ref 70–99)

## 2017-04-23 NOTE — Patient Instructions (Signed)
Bland Diet A bland diet consists of foods that do not have a lot of fat or fiber. Foods without fat or fiber are easier for the body to digest. They are also less likely to irritate your mouth, throat, stomach, and other parts of your gastrointestinal tract. A bland diet is sometimes called a BRAT diet. What is my plan? Your health care provider or dietitian may recommend specific changes to your diet to prevent and treat your symptoms, such as:  Eating small meals often.  Cooking food until it is soft enough to chew easily.  Chewing your food well.  Drinking fluids slowly.  Not eating foods that are very spicy, sour, or fatty.  Not eating citrus fruits, such as oranges and grapefruit.  What do I need to know about this diet?  Eat a variety of foods from the bland diet food list.  Do not follow a bland diet longer than you have to.  Ask your health care provider whether you should take vitamins. What foods can I eat? Grains  Hot cereals, such as cream of wheat. Bread, crackers, or tortillas made from refined white flour. Rice. Vegetables Canned or cooked vegetables. Mashed or boiled potatoes. Fruits Bananas. Applesauce. Other types of cooked or canned fruit with the skin and seeds removed, such as canned peaches or pears. Meats and Other Protein Sources Scrambled eggs. Creamy peanut butter or other nut butters. Lean, well-cooked meats, such as chicken or fish. Tofu. Soups or broths. Dairy Low-fat dairy products, such as milk, cottage cheese, or yogurt. Beverages Water. Herbal tea. Apple juice. Sweets and Desserts Pudding. Custard. Fruit gelatin. Ice cream. Fats and Oils Mild salad dressings. Canola or olive oil. The items listed above may not be a complete list of allowed foods or beverages. Contact your dietitian for more options. What foods are not recommended? Foods and ingredients that are often not recommended include:  Spicy foods, such as hot sauce or  salsa.  Fried foods.  Sour foods, such as pickled or fermented foods.  Raw vegetables or fruits, especially citrus or berries.  Caffeinated drinks.  Alcohol.  Strongly flavored seasonings or condiments.  The items listed above may not be a complete list of foods and beverages that are not allowed. Contact your dietitian for more information. This information is not intended to replace advice given to you by your health care provider. Make sure you discuss any questions you have with your health care provider. Document Released: 09/03/2015 Document Revised: 10/18/2015 Document Reviewed: 05/24/2014 Elsevier Interactive Patient Education  2018 [employer]. Dehydration, Adult Dehydration is when there is not enough fluid or water in your body. This happens when you lose more fluids than you take in. Dehydration can range from mild to very bad. It should be treated right away to keep it from getting very bad. Symptoms of mild dehydration may include:  Thirst.  Dry lips.  Slightly dry mouth.  Dry, warm skin.  Dizziness. Symptoms of moderate dehydration may include:  Very dry mouth.  Muscle cramps.  Dark pee (urine). Pee may be the color of tea.  Your body making less pee.  Your eyes making fewer tears.  Heartbeat that is uneven or faster than normal (palpitations).  Headache.  Light-headedness, especially when you stand up from sitting.  Fainting (syncope). Symptoms of very bad dehydration may include:  Changes in skin, such as: ? Cold and clammy skin. ? Blotchy (mottled) or pale skin. ? Skin that does not quickly return to normal after  being lightly pinched and let go (poor skin turgor).  Changes in body fluids, such as: ? Feeling very thirsty. ? Your eyes making fewer tears. ? Not sweating when body temperature is high, such as in hot weather. ? Your body making very little pee.  Changes in vital signs, such as: ? Weak pulse. ? Pulse that is more than  100 beats a minute when you are sitting still. ? Fast breathing. ? Low blood pressure.  Other changes, such as: ? Sunken eyes. ? Cold hands and feet. ? Confusion. ? Lack of energy (lethargy). ? Trouble waking up from sleep. ? Short-term weight loss. ? Unconsciousness. Follow these instructions at home:  If told by your doctor, drink an ORS: ? Make an ORS by using instructions on the package. ? Start by drinking small amounts, about  cup (120 mL) every 5-10 minutes. ? Slowly drink more until you have had the amount that your doctor said to have.  Drink enough clear fluid to keep your pee clear or pale yellow. If you were told to drink an ORS, finish the ORS first, then start slowly drinking clear fluids. Drink fluids such as: ? Water. Do not drink only water by itself. Doing that can make the salt (sodium) level in your body get too low (hyponatremia). ? Ice chips. ? Fruit juice that you have added water to (diluted). ? Low-calorie sports drinks.  Avoid: ? Alcohol. ? Drinks that have a lot of sugar. These include high-calorie sports drinks, fruit juice that does not have water added, and soda. ? Caffeine. ? Foods that are greasy or have a lot of fat or sugar.  Take over-the-counter and prescription medicines only as told by your doctor.  Do not take salt tablets. Doing that can make the salt level in your body get too high (hypernatremia).  Eat foods that have minerals (electrolytes). Examples include bananas, oranges, potatoes, tomatoes, and spinach.  Keep all follow-up visits as told by your doctor. This is important. Contact a doctor if:  You have belly (abdominal) pain that: ? Gets worse. ? Stays in one area (localizes).  You have a rash.  You have a stiff neck.  You get angry or annoyed more easily than normal (irritability).  You are more sleepy than normal.  You have a harder time waking up than normal.  You feel: ? Weak. ? Dizzy. ? Very thirsty.  You  have peed (urinated) only a small amount of very dark pee during 6-8 hours. Get help right away if:  You have symptoms of very bad dehydration.  You cannot drink fluids without throwing up (vomiting).  Your symptoms get worse with treatment.  You have a fever.  You have a very bad headache.  You are throwing up or having watery poop (diarrhea) and it: ? Gets worse. ? Does not go away.  You have blood or something green (bile) in your throw-up.  You have blood in your poop (stool). This may cause poop to look black and tarry.  You have not peed in 6-8 hours.  You pass out (faint).  Your heart rate when you are sitting still is more than 100 beats a minute.  You have trouble breathing. This information is not intended to replace advice given to you by your health care provider. Make sure you discuss any questions you have with your health care provider. Document Released: 03/08/2009 Document Revised: 11/30/2015 Document Reviewed: 07/06/2015 Elsevier Interactive Patient Education  2018 [employer].

## 2017-04-23 NOTE — Progress Notes (Signed)
Patient: Andrea Soto Female    DOB: May 19, 1961   56 y.o.   MRN: [id number] Visit Date: 04/23/2017  Today's Provider: Lavon Paganini, MD   Chief Complaint  Patient presents with  . Fatigue   Subjective:    HPI She report that she had the GI bug on Sunday with nausea, vomiting and diarrhea. Still having some residual diarrhea through today. Appetite has been decreased. Last ate at 6 am today. She reports that she started to feel weak around 11:30 am this morning while at work. She reports that she was feeling like she was going to faint. She had 1/2 can Sprite at work before arriving. Reports she was feeling fine this morning. She does have T2DM. She does report some improvements since lying down and after the sprite. States at this time she feels like she just wants to go home and go to sleep.      Allergies  Allergen Reactions  . Augmentin [Amoxicillin-Pot Clavulanate] Diarrhea     Current Outpatient Medications:  .  aspirin 81 MG tablet, Take by mouth., Disp: , Rfl:  .  atorvastatin (LIPITOR) 40 MG tablet, Take 1 tablet (40 mg total) daily by mouth., Disp: 90 tablet, Rfl: 3 .  Coenzyme Q10 (COQ10) 200 MG CAPS, Take by mouth., Disp: , Rfl:  .  hydrochlorothiazide (HYDRODIURIL) 25 MG tablet, Take 1 tablet (25 mg total) daily by mouth., Disp: 90 tablet, Rfl: 3 .  metFORMIN (GLUCOPHAGE) 1000 MG tablet, Take 1 tablet (1,000 mg total) 2 (two) times daily with a meal by mouth., Disp: 180 tablet, Rfl: 3 .  sertraline (ZOLOFT) 100 MG tablet, Take 1 tablet (100 mg total) daily by mouth., Disp: 90 tablet, Rfl: 3 .  lansoprazole (PREVACID) 30 MG capsule, Take 30 mg by mouth daily at 12 noon., Disp: , Rfl:  .  meloxicam (MOBIC) 7.5 MG tablet, Take 1 tablet (7.5 mg total) daily by mouth. (Patient not taking: Reported on 04/23/2017), Disp: 90 tablet, Rfl: 0  Review of Systems  Constitutional: Positive for appetite change and fatigue.  Respiratory: Negative for cough, chest tightness  and shortness of breath.   Cardiovascular: Negative for chest pain, palpitations and leg swelling.  Gastrointestinal: Positive for diarrhea and nausea. Negative for abdominal pain, constipation and vomiting.  Neurological: Positive for dizziness, weakness, light-headedness and headaches.       Pre-syncopal    Social History   Tobacco Use  . Smoking status: Never Smoker  . Smokeless tobacco: Never Used  Substance Use Topics  . Alcohol use: Yes    Alcohol/week: 0.6 oz    Types: 1 Glasses of wine per week   Objective:   BP (!) 158/92 (BP Location: Left Arm, Patient Position: Sitting, Cuff Size: Normal)   Pulse 78   Temp (!) 97.5 F (36.4 C) (Oral)   SpO2 96%     Physical Exam  Constitutional: She is oriented to person, place, and time. She appears well-developed and well-nourished. No distress.  HENT:  Head: Normocephalic and atraumatic.  Right Ear: Hearing, tympanic membrane, external ear and ear canal normal.  Left Ear: Hearing, tympanic membrane, external ear and ear canal normal.  Nose: Nose normal.  Mouth/Throat: Uvula is midline, oropharynx is clear and moist and mucous membranes are normal. No oropharyngeal exudate.  Eyes: Conjunctivae are normal. Pupils are equal, round, and reactive to light. Right eye exhibits no discharge. Left eye exhibits no discharge. No scleral icterus.  Neck: Normal range of motion.  Neck supple. No tracheal deviation present. No thyromegaly present.  Cardiovascular: Normal rate, regular rhythm and normal heart sounds. Exam reveals no gallop and no friction rub.  No murmur heard. Pulmonary/Chest: Effort normal and breath sounds normal. No stridor. No respiratory distress. She has no wheezes. She has no rales.  Lymphadenopathy:    She has no cervical adenopathy.  Neurological: She is alert and oriented to person, place, and time. No cranial nerve deficit. Coordination normal.  Skin: Skin is warm and dry. She is not diaphoretic.  Vitals  reviewed.      Assessment & Plan:     1. Near syncope Random glucose was 154. EKG showed NSR rate of 82 with no ST changes unchanged from previous EKG done in 2007. Will order other labs to see if electrolyte abnormality noted. I will f/u pending results. I will see her back in 3 days to make sure she is improving. Advised to push fluids. Suspect dehydration and low blood sugar. Strict return precautions given or reasons to go to the hospital. Patient and husband voice understanding.  - EKG 12-Lead - POCT glucose (manual entry) - CBC w/Diff/Platelet - Comprehensive Metabolic Panel (CMET) - Magnesium  2. Type 2 diabetes mellitus without complication, unspecified whether long term insulin use ([place]) See above medical treatment plan. - CBC w/Diff/Platelet - Comprehensive Metabolic Panel (CMET) - Magnesium  3. Dehydration See above medical treatment plan. - CBC w/Diff/Platelet - Comprehensive Metabolic Panel (CMET) - Magnesium       Lavon Paganini, MD  [facility]

## 2017-04-24 LAB — CBC WITH DIFFERENTIAL/PLATELET
BASOS ABS: 0.1 10*3/uL (ref 0.0–0.2)
Basos: 1 %
EOS (ABSOLUTE): 0.7 10*3/uL — ABNORMAL HIGH (ref 0.0–0.4)
Eos: 7 %
HEMOGLOBIN: 13.2 g/dL (ref 11.1–15.9)
Hematocrit: 38.7 % (ref 34.0–46.6)
IMMATURE GRANS (ABS): 0 10*3/uL (ref 0.0–0.1)
IMMATURE GRANULOCYTES: 0 %
LYMPHS: 27 %
Lymphocytes Absolute: 2.9 10*3/uL (ref 0.7–3.1)
MCH: 27.6 pg (ref 26.6–33.0)
MCHC: 34.1 g/dL (ref 31.5–35.7)
MCV: 81 fL (ref 79–97)
MONOCYTES: 6 %
Monocytes Absolute: 0.6 10*3/uL (ref 0.1–0.9)
NEUTROS ABS: 6.6 10*3/uL (ref 1.4–7.0)
NEUTROS PCT: 59 %
PLATELETS: 290 10*3/uL (ref 150–379)
RBC: 4.78 x10E6/uL (ref 3.77–5.28)
RDW: 14 % (ref 12.3–15.4)
WBC: 10.9 10*3/uL — AB (ref 3.4–10.8)

## 2017-04-24 LAB — COMPREHENSIVE METABOLIC PANEL
ALBUMIN: 4.4 g/dL (ref 3.5–5.5)
ALT: 28 IU/L (ref 0–32)
AST: 18 IU/L (ref 0–40)
Albumin/Globulin Ratio: 1.9 (ref 1.2–2.2)
Alkaline Phosphatase: 80 IU/L (ref 39–117)
BUN / CREAT RATIO: 22 (ref 9–23)
BUN: 16 mg/dL (ref 6–24)
Bilirubin Total: <0.2 mg/dL (ref 0.0–1.2)
CALCIUM: 9.5 mg/dL (ref 8.7–10.2)
CHLORIDE: 96 mmol/L (ref 96–106)
CO2: 25 mmol/L (ref 20–29)
CREATININE: 0.74 mg/dL (ref 0.57–1.00)
GFR calc non Af Amer: 90 mL/min/{1.73_m2} (ref >59)
GFR, EST AFRICAN AMERICAN: 103 mL/min/{1.73_m2} (ref >59)
GLUCOSE: 107 mg/dL — AB (ref 65–99)
Globulin, Total: 2.3 g/dL (ref 1.5–4.5)
Potassium: 3.7 mmol/L (ref 3.5–5.2)
Sodium: 141 mmol/L (ref 134–144)
TOTAL PROTEIN: 6.7 g/dL (ref 6.0–8.5)

## 2017-04-24 LAB — MAGNESIUM: MAGNESIUM: 1.7 mg/dL (ref 1.6–2.3)

## 2017-04-24 LAB — SPECIMEN STATUS REPORT

## 2017-04-27 ENCOUNTER — Ambulatory Visit: Payer: Self-pay | Admitting: Physician Assistant

## 2017-06-01 ENCOUNTER — Other Ambulatory Visit: Payer: Self-pay | Admitting: Family Medicine

## 2017-06-11 ENCOUNTER — Ambulatory Visit (INDEPENDENT_AMBULATORY_CARE_PROVIDER_SITE_OTHER): Payer: 59 | Admitting: Family Medicine

## 2017-06-11 ENCOUNTER — Encounter: Payer: Self-pay | Admitting: Family Medicine

## 2017-06-11 VITALS — BP 116/76 | HR 78 | Temp 97.7°F | Resp 14 | Ht 62.0 in | Wt 150.0 lb

## 2017-06-11 DIAGNOSIS — E7800 Pure hypercholesterolemia, unspecified: Secondary | ICD-10-CM

## 2017-06-11 DIAGNOSIS — L578 Other skin changes due to chronic exposure to nonionizing radiation: Secondary | ICD-10-CM | POA: Diagnosis not present

## 2017-06-11 DIAGNOSIS — Z0000 Encounter for general adult medical examination without abnormal findings: Secondary | ICD-10-CM

## 2017-06-11 DIAGNOSIS — I10 Essential (primary) hypertension: Secondary | ICD-10-CM

## 2017-06-11 DIAGNOSIS — D1800 Hemangioma unspecified site: Secondary | ICD-10-CM | POA: Diagnosis not present

## 2017-06-11 DIAGNOSIS — E119 Type 2 diabetes mellitus without complications: Secondary | ICD-10-CM

## 2017-06-11 DIAGNOSIS — L821 Other seborrheic keratosis: Secondary | ICD-10-CM | POA: Diagnosis not present

## 2017-06-11 NOTE — Progress Notes (Signed)
Patient: Andrea Soto, Female    DOB: 1960/09/29, 57 y.o.   MRN: [id number] Visit Date: 06/11/2017  Today's Provider: [name], MD   Chief Complaint  Patient presents with  . Annual Exam   Subjective:    Annual physical exam Andrea Soto is a 57 y.o. female who presents today for health maintenance and complete physical. She feels well. She reports she is not exercising. She reports she is sleeping well.  ----------------------------------------------------------------- Colonoscopy- 09/21/08 Dr. Allen Norris Normal Pap and Mammogram- through GYN   Review of Systems  Constitutional: Negative.   HENT: Negative.   Eyes: Negative.   Respiratory: Negative.   Cardiovascular: Negative.   Gastrointestinal: Positive for diarrhea.  Endocrine: Negative.   Genitourinary: Negative.   Musculoskeletal: Negative.   Skin: Negative.   Allergic/Immunologic: Negative.   Neurological: Negative.   Hematological: Negative.   Psychiatric/Behavioral: Negative.     Social History      She  reports that  has never smoked. she has never used smokeless tobacco. She reports that she drinks about 0.6 oz of alcohol per week. She reports that she does not use drugs.       Social History   Socioeconomic History  . Marital status: Married    Spouse name: [name]  . Number of children: 2  . Years of education: 71  . Highest education level: None  Social Needs  . Financial resource strain: None  . Food insecurity - worry: None  . Food insecurity - inability: None  . Transportation needs - medical: None  . Transportation needs - non-medical: None  Occupational History  . Occupation: [employer]  Tobacco Use  . Smoking status: Never Smoker  . Smokeless tobacco: Never Used  Substance and Sexual Activity  . Alcohol use: Yes    Alcohol/week: 0.6 oz    Types: 1 Glasses of wine per week    Comment: once a month  . Drug use: No  . Sexual activity: Yes    Birth control/protection: None    Other Topics Concern  . None  Social History Narrative  . None    Past Medical History:  Diagnosis Date  . Depression   . Hyperlipidemia      Patient Active Problem List   Diagnosis Date Noted  . 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 Depression, major, single episode, mild ([place]) 10/14/2014  . Acid reflux 10/14/2014  . H/O transient cerebral ischemia 10/14/2014  . Hypercholesteremia 10/14/2014  . BP (high blood pressure) 10/14/2014  . 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 Diabetes mellitus, type 2 ([place]) 10/14/2014    Past Surgical History:  Procedure Laterality Date  . BREAST BIOPSY  1978  . ENDOMETRIAL BIOPSY      Family History        Family Status  Relation Name Status  . Mother  Alive  . Father  Deceased at age 38  . Sister  Alive  . Daughter  Alive  . Son  Alive  . Brother  Alive  . PGM  Deceased        Her family history includes Allergic rhinitis in her mother and son; Bone cancer in her paternal grandmother; Breast cancer in her paternal grandmother; Cancer in her father; Epilepsy in her daughter; Heart attack in her father; Hypertension in her mother and sister; Multiple myeloma in her father; Seizures in her daughter.     Allergies  Allergen Reactions  . Augmentin [Amoxicillin-Pot Clavulanate] Diarrhea     Current Outpatient  Medications:  .  aspirin 81 MG tablet, Take by mouth., Disp: , Rfl:  .  atorvastatin (LIPITOR) 40 MG tablet, Take 1 tablet (40 mg total) daily by mouth., Disp: 90 tablet, Rfl: 3 .  Coenzyme Q10 (COQ10) 200 MG CAPS, Take by mouth., Disp: , Rfl:  .  esomeprazole (NEXIUM) 20 MG capsule, Take 20 mg by mouth daily at 12 noon., Disp: , Rfl:  .  hydrochlorothiazide (HYDRODIURIL) 25 MG tablet, Take 1 tablet (25 mg total) daily by mouth., Disp: 90 tablet, Rfl: 3 .  meloxicam (MOBIC) 7.5 MG tablet, Take 1 tablet (7.5 mg total) by mouth daily as needed., Disp: 90 tablet, Rfl: 3 .  metFORMIN (GLUCOPHAGE) 1000 MG tablet, Take 1 tablet (1,000 mg total) 2  (two) times daily with a meal by mouth., Disp: 180 tablet, Rfl: 3 .  sertraline (ZOLOFT) 100 MG tablet, Take 1 tablet (100 mg total) daily by mouth., Disp: 90 tablet, Rfl: 3   Patient Care Team: Jerrol Banana., MD as PCP - General (Family Medicine)      Objective:   Vitals: BP 116/76 (BP Location: Right Arm, Patient Position: Sitting, Cuff Size: Normal)   Pulse 78   Temp 97.7 F (36.5 C) (Oral)   Resp 14   Ht '5\' 2"'  (1.575 m)   Wt 150 lb (68 kg)   BMI 27.44 kg/m    Vitals:   06/11/17 0906  BP: 116/76  Pulse: 78  Resp: 14  Temp: 97.7 F (36.5 C)  TempSrc: Oral  Weight: 150 lb (68 kg)  Height: '5\' 2"'  (1.575 m)     Physical Exam  Constitutional: She is oriented to person, place, and time. She appears well-developed and well-nourished.  HENT:  Head: Normocephalic and atraumatic.  Right Ear: External ear normal.  Left Ear: External ear normal.  Nose: Nose normal.  Mouth/Throat: Oropharynx is clear and moist.  Eyes: Conjunctivae are normal. No scleral icterus.  Neck: No thyromegaly present.  Cardiovascular: Normal rate, regular rhythm, normal heart sounds and intact distal pulses.  Pulmonary/Chest: Effort normal and breath sounds normal.  Abdominal: Soft.  Lymphadenopathy:    She has no cervical adenopathy.  Neurological: She is alert and oriented to person, place, and time.  Skin: Skin is warm and dry.  Psychiatric: She has a normal mood and affect. Her behavior is normal. Judgment and thought content normal.     Depression Screen PHQ 2/9 Scores 06/11/2017 02/09/2017 01/29/2016  PHQ - 2 Score 0 0 0  PHQ- 9 Score 0 1 -      Assessment & Plan:    1. Annual physical exam Gyn per [facility] - CBC with Differential/Platelet - Comprehensive metabolic panel - TSH - Lipid Panel With LDL/HDL Ratio  2. Type 2 diabetes mellitus without complication, unspecified whether long term insulin use (HCC) - HgB A1c  3. Essential hypertension - CBC with  Differential/Platelet - Comprehensive metabolic panel  4. Hypercholesteremia - Lipid Panel With LDL/HDL Ratio    [name], MD  [facility]

## 2017-06-22 DIAGNOSIS — D485 Neoplasm of uncertain behavior of skin: Secondary | ICD-10-CM | POA: Diagnosis not present

## 2017-07-03 ENCOUNTER — Ambulatory Visit (INDEPENDENT_AMBULATORY_CARE_PROVIDER_SITE_OTHER): Payer: 59 | Admitting: Obstetrics and Gynecology

## 2017-07-03 ENCOUNTER — Encounter: Payer: Self-pay | Admitting: Obstetrics and Gynecology

## 2017-07-03 VITALS — BP 110/70 | HR 86 | Ht 61.0 in | Wt 149.0 lb

## 2017-07-03 DIAGNOSIS — Z1239 Encounter for other screening for malignant neoplasm of breast: Secondary | ICD-10-CM

## 2017-07-03 DIAGNOSIS — Z124 Encounter for screening for malignant neoplasm of cervix: Secondary | ICD-10-CM | POA: Diagnosis not present

## 2017-07-03 DIAGNOSIS — Z1231 Encounter for screening mammogram for malignant neoplasm of breast: Secondary | ICD-10-CM

## 2017-07-03 DIAGNOSIS — Z0000 Encounter for general adult medical examination without abnormal findings: Secondary | ICD-10-CM | POA: Diagnosis not present

## 2017-07-03 NOTE — Progress Notes (Signed)
Gynecology Annual Exam  PCP: Jerrol Banana., MD  Chief Complaint:  Chief Complaint  Patient presents with  . Gynecologic Exam    History of Present Illness:Patient is a 57 y.o. G2P2002 presents for annual exam. The patient has no complaints today.   LMP: No LMP recorded. Patient is postmenopausal. Menopause at 40. Denies symptoms of prolapse. Small stress incontinence with laughing. Does not desire therapy at this time Still sexually active, no discomfort.  The patient is sexually active. She denies dyspareunia.  The patient does not perform self breast exams.  There is no notable family history of breast or ovarian cancer in her family.  The patient wears seatbelts: yes.   The patient has regular exercise: not asked.    The patient denies current symptoms of depression.     Review of Systems: ROS  Past Medical History:  Past Medical History:  Diagnosis Date  . Breast cyst, left    solitary cyst benign  . Depression   . GERD (gastroesophageal reflux disease)   . Hyperlipidemia   . Hypertension     Past Surgical History:  Past Surgical History:  Procedure Laterality Date  . BREAST BIOPSY  1978  . ENDOMETRIAL BIOPSY    . HYSTEROSCOPY  03/04/2006    Gynecologic History:  No LMP recorded. Patient is postmenopausal. Last Pap: Results were: 2014 Please see [facility]  Last mammogram: 2015 Results were:Please see [facility] Obstetric History: G2P2002  Family History:  Family History  Problem Relation Age of Onset  . Hypertension Mother   . Allergic rhinitis Mother   . Cancer Father        multiple myeloma and bladder cancer.  Marland Kitchen Heart attack Father   . Multiple myeloma Father   . Hypertension Sister   . Epilepsy Daughter   . Seizures Daughter   . Allergic rhinitis Son   . Breast cancer Paternal Grandmother 44  . Bone cancer Paternal Grandmother     Social History:  Social History   Socioeconomic History  . Marital status: Married    Spouse  name: [name]  . Number of children: 2  . Years of education: 69  . Highest education level: Not on file  Social Needs  . Financial resource strain: Not on file  . Food insecurity - worry: Not on file  . Food insecurity - inability: Not on file  . Transportation needs - medical: Not on file  . Transportation needs - non-medical: Not on file  Occupational History  . Occupation: [employer]  Tobacco Use  . Smoking status: Never Smoker  . Smokeless tobacco: Never Used  Substance and Sexual Activity  . Alcohol use: Yes    Alcohol/week: 0.6 oz    Types: 1 Glasses of wine per week    Comment: once a month  . Drug use: No  . Sexual activity: Yes    Birth control/protection: None  Other Topics Concern  . Not on file  Social History Narrative  . Not on file    Allergies:  Allergies  Allergen Reactions  . Augmentin [Amoxicillin-Pot Clavulanate] Diarrhea    Medications: Prior to Admission medications   Medication Sig Start Date End Date Taking? Authorizing Provider  aspirin 81 MG tablet Take by mouth.   Yes [provider]  atorvastatin (LIPITOR) 40 MG tablet Take 1 tablet (40 mg total) daily by mouth. 04/13/17  Yes Jerrol Banana., MD  Coenzyme Q10 (COQ10) 200 MG CAPS Take by mouth. 04/02/11  Yes [provider]  esomeprazole (NEXIUM) 20 MG capsule Take 20 mg by mouth daily at 12 noon.   Yes [provider]  hydrochlorothiazide (HYDRODIURIL) 25 MG tablet Take 1 tablet (25 mg total) daily by mouth. 04/13/17  Yes Jerrol Banana., MD  meloxicam (MOBIC) 7.5 MG tablet Take 1 tablet (7.5 mg total) by mouth daily as needed. 06/01/17  Yes Jerrol Banana., MD  metFORMIN (GLUCOPHAGE) 1000 MG tablet Take 1 tablet (1,000 mg total) 2 (two) times daily with a meal by mouth. 04/13/17  Yes Jerrol Banana., MD  sertraline (ZOLOFT) 100 MG tablet Take 1 tablet (100 mg total) daily by mouth. 04/13/17  Yes Jerrol Banana., MD    Physical  Exam Vitals: Blood pressure 110/70, pulse 86, height _0  (1.549 m), weight 149 lb (67.6 kg).  General: NAD HEENT: normocephalic, anicteric Thyroid: no enlargement, no palpable nodules Pulmonary: No increased work of breathing, CTAB Cardiovascular: RRR, distal pulses 2+ Breast: Breast symmetrical, no tenderness, no palpable nodules or masses, no skin or nipple retraction present, no nipple discharge.  No axillary or supraclavicular lymphadenopathy. Abdomen: NABS, soft, non-tender, non-distended.  Umbilicus without lesions.  No hepatomegaly, splenomegaly or masses palpable. No evidence of hernia  Genitourinary:  External: Normal external female genitalia.  Normal urethral meatus, normal  Bartholin's and Skene's glands.    Vagina: Normal vaginal mucosa, no evidence of prolapse.    Cervix: Grossly normal in appearance, no bleeding  Uterus: Non-enlarged, mobile, normal contour.  No CMT  Adnexa: ovaries non-enlarged, no adnexal masses  Rectal: deferred  Lymphatic: no evidence of inguinal lymphadenopathy Extremities: no edema, erythema, or tenderness Neurologic: Grossly intact Psychiatric: mood appropriate, affect full  Female chaperone present for pelvic and breast  portions of the physical exam     Assessment: 57 y.o. G2P2002 routine annual exam  Plan: Problem List Items Addressed This Visit    None      1) Mammogram - recommend yearly screening mammogram.  Mammogram Was ordered today  2) STI screening was offered and declined  3) ASCCP guidelines and rational discussed.  Pap smear performed today  4) Osteoporosis  - per USPTF routine screening DEXA at age 48 - FRAX 38 year major fracture risk 13,  10 year hip fracture risk 0.6  Consider FDA-approved medical therapies in postmenopausal women and men aged 14 years and older, based on the following: a) A hip or vertebral (clinical or morphometric) fracture b) T-score ? -2.5 at the femoral neck or spine after appropriate  evaluation to exclude secondary causes C) Low bone mass (T-score between -1.0 and -2.5 at the femoral neck or spine) and a 10-year probability of a hip fracture ? 3% or a 10-year probability of a major osteoporosis-related fracture ? 20% based on the [place]-adapted WHO algorithm   5) Routine healthcare maintenance including cholesterol, diabetes screening discussed managed by PCP  6) Colonoscopy last performed at 48. She states that her primary care physician will be referring her for this.  Screening recommended starting at age 30 for average risk individuals, age 38 for individuals deemed at increased risk (including African Americans) and recommended to continue until age 27.  For patient age 74-85 individualized approach is recommended.  Gold standard screening is via colonoscopy, Cologuard screening is an acceptable alternative for patient unwilling or unable to undergo colonoscopy.  "Colorectal cancer screening for average?risk adults: 2018 guideline update from the American Cancer Society"CA: A Cancer Journal for Clinicians: Oct 22, 2016   Return to care in 1 year  Adrian Prows MD [facility] Pigeon, [facility] 07/03/2017, 1:55 PM

## 2017-07-06 LAB — PAPIG, HPV, RFX 16/18
HPV, HIGH-RISK: NEGATIVE
PAP Smear Comment: 0

## 2017-07-09 DIAGNOSIS — D485 Neoplasm of uncertain behavior of skin: Secondary | ICD-10-CM | POA: Diagnosis not present

## 2017-07-09 DIAGNOSIS — D229 Melanocytic nevi, unspecified: Secondary | ICD-10-CM | POA: Diagnosis not present

## 2017-07-13 NOTE — Progress Notes (Signed)
Release to mychart, NIL, pap in 5 years

## 2017-08-06 DIAGNOSIS — E119 Type 2 diabetes mellitus without complications: Secondary | ICD-10-CM | POA: Diagnosis not present

## 2017-08-06 LAB — HM DIABETES EYE EXAM

## 2017-08-07 ENCOUNTER — Ambulatory Visit
Admission: RE | Admit: 2017-08-07 | Discharge: 2017-08-07 | Disposition: A | Discharge status: Home / Self Care | Payer: 59 | Source: Ambulatory Visit | Attending: Obstetrics and Gynecology | Admitting: Obstetrics and Gynecology

## 2017-08-07 DIAGNOSIS — Z1231 Encounter for screening mammogram for malignant neoplasm of breast: Secondary | ICD-10-CM | POA: Diagnosis not present

## 2017-08-07 DIAGNOSIS — Z1239 Encounter for other screening for malignant neoplasm of breast: Secondary | ICD-10-CM

## 2017-08-13 ENCOUNTER — Inpatient Hospital Stay
Admission: RE | Admit: 2017-08-13 | Discharge: 2017-08-13 | Disposition: A | Discharge status: Home / Self Care | Payer: Self-pay | Source: Ambulatory Visit | Attending: *Deleted | Admitting: *Deleted

## 2017-08-13 ENCOUNTER — Other Ambulatory Visit: Payer: Self-pay | Admitting: *Deleted

## 2017-08-13 DIAGNOSIS — Z9289 Personal history of other medical treatment: Secondary | ICD-10-CM

## 2017-08-18 DIAGNOSIS — J209 Acute bronchitis, unspecified: Secondary | ICD-10-CM | POA: Diagnosis not present

## 2017-08-18 DIAGNOSIS — J029 Acute pharyngitis, unspecified: Secondary | ICD-10-CM | POA: Diagnosis not present

## 2017-08-19 NOTE — Progress Notes (Signed)
Called and discussed result with patient, birads 1 repeat in 1 year.

## 2017-10-13 ENCOUNTER — Ambulatory Visit: Payer: 59 | Admitting: Family Medicine

## 2017-10-13 VITALS — BP 122/64 | HR 77 | Temp 98.2°F | Resp 16 | Wt 146.0 lb

## 2017-10-13 DIAGNOSIS — E7800 Pure hypercholesterolemia, unspecified: Secondary | ICD-10-CM

## 2017-10-13 DIAGNOSIS — I10 Essential (primary) hypertension: Secondary | ICD-10-CM

## 2017-10-13 DIAGNOSIS — E119 Type 2 diabetes mellitus without complications: Secondary | ICD-10-CM

## 2017-10-13 NOTE — Progress Notes (Signed)
Andrea Soto  MRN: [id number] DOB: [birth year]  Subjective:  HPI   The patient is a 57 year old female who presents for follow up of chronic health.  She was last seen on 06/11/17 for her annual exam and had her labs were ordered at that time.  The patient reports she had lost her order slip and planned to have them done today as she is fasting.  She is now retired from [employer]. Diabetes-  Lab Results  Component Value Date   HGBA1C 6.7 02/09/2017   Hypertension- BP Readings from Last 3 Encounters:  10/13/17 122/64  07/03/17 110/70  06/11/17 116/76   Hypercholesterolemia Lab Results  Component Value Date   CHOL 131 12/25/2016   HDL 32 (L) 12/25/2016   LDLCALC 73 12/25/2016   TRIG 128 12/25/2016   CHOLHDL 4.1 12/25/2016     Patient Active Problem List   Diagnosis Date Noted  . 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 Depression, major, single episode, mild ([place]) 10/14/2014  . Acid reflux 10/14/2014  . H/O transient cerebral ischemia 10/14/2014  . Hypercholesteremia 10/14/2014  . BP (high blood pressure) 10/14/2014  . 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 Diabetes mellitus, type 2 ([place]) 10/14/2014    Past Medical History:  Diagnosis Date  . Breast cyst, left    solitary cyst benign  . Depression   . GERD (gastroesophageal reflux disease)   . Hyperlipidemia   . Hypertension     Social History   Socioeconomic History  . Marital status: Married    Spouse name: [name]  . Number of children: 2  . Years of education: 56  . Highest education level: Not on file  Occupational History  . Occupation: [employer]  Social Needs  . Financial resource strain: Not on file  . Food insecurity:    Worry: Not on file    Inability: Not on file  . Transportation needs:    Medical: Not on file    Non-medical: Not on file  Tobacco Use  . Smoking status: Never Smoker  . Smokeless tobacco: Never Used  Substance and Sexual Activity  . Alcohol use: Yes    Alcohol/week: 0.6 oz    Types: 1 Glasses  of wine per week    Comment: once a month  . Drug use: No  . Sexual activity: Yes    Birth control/protection: None  Lifestyle  . Physical activity:    Days per week: 3 days    Minutes per session: 30 min  . Stress: Not at all  Relationships  . Social connections:    Talks on phone: More than three times a week    Gets together: More than three times a week    Attends religious service: More than 4 times per year    Active member of club or organization: Yes    Attends meetings of clubs or organizations: More than 4 times per year    Relationship status: Married  . Intimate partner violence:    Fear of current or ex partner: No    Emotionally abused: No    Physically abused: No    Forced sexual activity: No  Other Topics Concern  . Not on file  Social History Narrative  . Not on file    Outpatient Encounter Medications as of 10/13/2017  Medication Sig Note  . aspirin 81 MG tablet Take by mouth. 10/14/2014: Received from: [facility]  . atorvastatin (LIPITOR) 40 MG tablet Take 1 tablet (40 mg total) daily by mouth.   Marland Kitchen  Coenzyme Q10 (COQ10) 200 MG CAPS Take by mouth. 10/14/2014: Received from: [facility]  . esomeprazole (NEXIUM) 20 MG capsule Take 20 mg by mouth daily at 12 noon.   . hydrochlorothiazide (HYDRODIURIL) 25 MG tablet Take 1 tablet (25 mg total) daily by mouth.   . meloxicam (MOBIC) 7.5 MG tablet Take 1 tablet (7.5 mg total) by mouth daily as needed.   . metFORMIN (GLUCOPHAGE) 1000 MG tablet Take 1 tablet (1,000 mg total) 2 (two) times daily with a meal by mouth.   . sertraline (ZOLOFT) 100 MG tablet Take 1 tablet (100 mg total) daily by mouth.    No facility-administered encounter medications on file as of 10/13/2017.     Allergies  Allergen Reactions  . Augmentin [Amoxicillin-Pot Clavulanate] Diarrhea    Review of Systems  Constitutional: Negative for fever and malaise/fatigue.  Eyes: Negative.   Respiratory: Negative for  cough, shortness of breath and wheezing.   Cardiovascular: Negative for chest pain, palpitations, orthopnea, claudication and leg swelling.  Gastrointestinal: Negative.   Genitourinary: Negative for urgency.  Skin: Negative.   Neurological: Positive for dizziness (sometimes when she first gets up she feels a little dizzy. patient thinks this is coming from her drinking less fluids now that she is retired.). Negative for headaches.  Endo/Heme/Allergies: Negative.  Negative for polydipsia.  Psychiatric/Behavioral: Negative.     Objective:  BP 122/64 (BP Location: Right Arm, Patient Position: Sitting, Cuff Size: Normal)   Pulse 77   Temp 98.2 F (36.8 C) (Oral)   Resp 16   Wt 146 lb (66.2 kg)   BMI 27.59 kg/m   Physical Exam  Constitutional: She is oriented to person, place, and time and well-developed, well-nourished, and in no distress.  HENT:  Head: Normocephalic and atraumatic.  Eyes: Conjunctivae are normal.  Neck: No thyromegaly present.  Cardiovascular: Normal rate and regular rhythm.  Pulmonary/Chest: Effort normal and breath sounds normal.  Abdominal: Soft.  Musculoskeletal: She exhibits no edema.  Neurological: She is alert and oriented to person, place, and time. Gait normal. GCS score is 15.  Skin: Skin is warm and dry.  Psychiatric: Mood, memory, affect and judgment normal.    Assessment and Plan :  1. Essential hypertension  - CBC with Differential/Platelet - Comprehensive metabolic panel - TSH  2. Type 2 diabetes mellitus without complication, unspecified whether long term insulin use (HCC)  - Comprehensive metabolic panel - Hemoglobin A1c  3. Hypercholesteremia  - Lipid Panel With LDL/HDL Ratio  I have done the exam and reviewed the chart and it is accurate to the best of my knowledge. Development worker, community has been used and  any errors in dictation or transcription are unintentional. Miguel Aschoff M.D. [facility]

## 2017-10-14 ENCOUNTER — Telehealth: Payer: Self-pay

## 2017-10-14 LAB — HEMOGLOBIN A1C
ESTIMATED AVERAGE GLUCOSE: 146 mg/dL
Hgb A1c MFr Bld: 6.7 % — ABNORMAL HIGH (ref 4.8–5.6)

## 2017-10-14 LAB — CBC WITH DIFFERENTIAL/PLATELET
BASOS ABS: 0 10*3/uL (ref 0.0–0.2)
BASOS: 0 %
EOS (ABSOLUTE): 0.3 10*3/uL (ref 0.0–0.4)
Eos: 3 %
HEMOGLOBIN: 13.2 g/dL (ref 11.1–15.9)
Hematocrit: 39.2 % (ref 34.0–46.6)
Immature Grans (Abs): 0 10*3/uL (ref 0.0–0.1)
Immature Granulocytes: 0 %
Lymphocytes Absolute: 2.5 10*3/uL (ref 0.7–3.1)
Lymphs: 26 %
MCH: 27.2 pg (ref 26.6–33.0)
MCHC: 33.7 g/dL (ref 31.5–35.7)
MCV: 81 fL (ref 79–97)
Monocytes Absolute: 0.5 10*3/uL (ref 0.1–0.9)
Monocytes: 6 %
NEUTROS ABS: 6.1 10*3/uL (ref 1.4–7.0)
Neutrophils: 65 %
PLATELETS: 263 10*3/uL (ref 150–450)
RBC: 4.85 x10E6/uL (ref 3.77–5.28)
RDW: 14.7 % (ref 12.3–15.4)
WBC: 9.4 10*3/uL (ref 3.4–10.8)

## 2017-10-14 LAB — LIPID PANEL WITH LDL/HDL RATIO
Cholesterol, Total: 147 mg/dL (ref 100–199)
HDL: 36 mg/dL — ABNORMAL LOW (ref >39)
LDL Calculated: 78 mg/dL (ref 0–99)
LDl/HDL Ratio: 2.2 ratio (ref 0.0–3.2)
TRIGLYCERIDES: 165 mg/dL — AB (ref 0–149)
VLDL CHOLESTEROL CAL: 33 mg/dL (ref 5–40)

## 2017-10-14 LAB — COMPREHENSIVE METABOLIC PANEL
ALBUMIN: 4.7 g/dL (ref 3.5–5.5)
ALT: 23 IU/L (ref 0–32)
AST: 23 IU/L (ref 0–40)
Albumin/Globulin Ratio: 1.7 (ref 1.2–2.2)
Alkaline Phosphatase: 73 IU/L (ref 39–117)
BUN/Creatinine Ratio: 24 — ABNORMAL HIGH (ref 9–23)
BUN: 17 mg/dL (ref 6–24)
Bilirubin Total: 0.3 mg/dL (ref 0.0–1.2)
CO2: 26 mmol/L (ref 20–29)
CREATININE: 0.72 mg/dL (ref 0.57–1.00)
Calcium: 9.4 mg/dL (ref 8.7–10.2)
Chloride: 97 mmol/L (ref 96–106)
GFR calc Af Amer: 106 mL/min/{1.73_m2} (ref >59)
GFR calc non Af Amer: 92 mL/min/{1.73_m2} (ref >59)
GLOBULIN, TOTAL: 2.7 g/dL (ref 1.5–4.5)
Glucose: 108 mg/dL — ABNORMAL HIGH (ref 65–99)
Potassium: 3.7 mmol/L (ref 3.5–5.2)
SODIUM: 142 mmol/L (ref 134–144)
Total Protein: 7.4 g/dL (ref 6.0–8.5)

## 2017-10-14 LAB — TSH: TSH: 3.82 u[IU]/mL (ref 0.450–4.500)

## 2017-10-14 NOTE — Telephone Encounter (Signed)
[facility]  ED   ----- Message from Jerrol Banana., MD sent at 10/14/2017  1:22 PM EDT ----- Labs ok.

## 2017-10-14 NOTE — Telephone Encounter (Signed)
-----   Message from Jerrol Banana., MD sent at 10/14/2017  1:22 PM EDT ----- Labs ok.

## 2017-10-15 NOTE — Telephone Encounter (Signed)
Pt informed and voiced understanding of results. 

## 2017-12-15 ENCOUNTER — Other Ambulatory Visit: Payer: Self-pay

## 2017-12-15 DIAGNOSIS — E119 Type 2 diabetes mellitus without complications: Secondary | ICD-10-CM

## 2017-12-15 MED ORDER — METFORMIN HCL 1000 MG PO TABS
1000.0000 mg | ORAL_TABLET | Freq: Two times a day (BID) | ORAL | 3 refills | Status: DC
Start: 2017-12-15 — End: 2018-11-25

## 2017-12-15 MED ORDER — SERTRALINE HCL 100 MG PO TABS: 100.0000 mg | ORAL_TABLET | Freq: Every day | ORAL | 3 refills | Status: DC

## 2018-01-07 DIAGNOSIS — Z1283 Encounter for screening for malignant neoplasm of skin: Secondary | ICD-10-CM | POA: Diagnosis not present

## 2018-01-07 DIAGNOSIS — L578 Other skin changes due to chronic exposure to nonionizing radiation: Secondary | ICD-10-CM | POA: Diagnosis not present

## 2018-01-07 DIAGNOSIS — L821 Other seborrheic keratosis: Secondary | ICD-10-CM | POA: Diagnosis not present

## 2018-02-15 ENCOUNTER — Ambulatory Visit: Payer: 59 | Admitting: Family Medicine

## 2018-02-15 ENCOUNTER — Encounter: Payer: Self-pay | Admitting: Family Medicine

## 2018-02-15 VITALS — BP 116/72 | HR 83 | Temp 98.5°F | Wt 148.0 lb

## 2018-02-15 DIAGNOSIS — I10 Essential (primary) hypertension: Secondary | ICD-10-CM | POA: Diagnosis not present

## 2018-02-15 DIAGNOSIS — Z23 Encounter for immunization: Secondary | ICD-10-CM

## 2018-02-15 DIAGNOSIS — E7800 Pure hypercholesterolemia, unspecified: Secondary | ICD-10-CM | POA: Diagnosis not present

## 2018-02-15 DIAGNOSIS — E119 Type 2 diabetes mellitus without complications: Secondary | ICD-10-CM

## 2018-02-15 LAB — POCT GLYCOSYLATED HEMOGLOBIN (HGB A1C): Hemoglobin A1C: 6.8 % — AB (ref 4.0–5.6)

## 2018-02-15 NOTE — Progress Notes (Signed)
Patient: Andrea Soto Female    DOB: Jan 13, 1961   57 y.o.   MRN: [id number] Visit Date: 02/15/2018  Today's Provider: [name], MD   Chief Complaint  Patient presents with  . Hyperlipidemia  . Hypertension  . Diabetes   Subjective:    Diabetes  She presents for her follow-up diabetic visit. She has type 2 diabetes mellitus. Her disease course has been stable. There are no hypoglycemic associated symptoms. Pertinent negatives for hypoglycemia include no dizziness, headaches or sweats. There are no diabetic associated symptoms. Pertinent negatives for diabetes include no blurred vision and no chest pain. Symptoms are stable. She is compliant with treatment all of the time. There is no change in her home blood glucose trend. She does not see a podiatrist.Eye exam is current.  Hypertension  This is a chronic problem. The problem is unchanged. The problem is controlled. Pertinent negatives include no anxiety, blurred vision, chest pain, headaches, malaise/fatigue, neck pain, orthopnea, palpitations, peripheral edema, PND, shortness of breath or sweats. There are no associated agents to hypertension. Past treatments include diuretics. There are no compliance problems.   Hyperlipidemia  This is a chronic problem. The problem is controlled. Recent lipid tests were reviewed and are normal. Pertinent negatives include no chest pain or shortness of breath. Current antihyperlipidemic treatment includes statins. There are no compliance problems.    Lab Results  Component Value Date   CHOL 147 10/13/2017   CHOL 131 12/25/2016   CHOL 140 12/21/2015   Lab Results  Component Value Date   HDL 36 (L) 10/13/2017   HDL 32 (L) 12/25/2016   HDL 34 (L) 12/21/2015   Lab Results  Component Value Date   LDLCALC 78 10/13/2017   LDLCALC 73 12/25/2016   LDLCALC 76 12/21/2015   Lab Results  Component Value Date   TRIG 165 (H) 10/13/2017   TRIG 128 12/25/2016   TRIG 151 (H) 12/21/2015    Lab Results  Component Value Date   CHOLHDL 4.1 12/25/2016   No results found for: LDLDIRECT Lab Results  Component Value Date   HGBA1C 6.8 (A) 02/15/2018   BP Readings from Last 3 Encounters:  02/15/18 116/72  10/13/17 122/64  07/03/17 110/70   Wt Readings from Last 3 Encounters:  02/15/18 148 lb (67.1 kg)  10/13/17 146 lb (66.2 kg)  07/03/17 149 lb (67.6 kg)   Patient Active Problem List   Diagnosis Date Noted  . 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 Depression, major, single episode, mild ([place]) 10/14/2014  . Acid reflux 10/14/2014  . H/O transient cerebral ischemia 10/14/2014  . Hypercholesteremia 10/14/2014  . BP (high blood pressure) 10/14/2014  . 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 Diabetes mellitus, type 2 ([place]) 10/14/2014       Allergies  Allergen Reactions  . Augmentin [Amoxicillin-Pot Clavulanate] Diarrhea     Current Outpatient Medications:  .  aspirin 81 MG tablet, Take by mouth., Disp: , Rfl:  .  atorvastatin (LIPITOR) 40 MG tablet, Take 1 tablet (40 mg total) daily by mouth., Disp: 90 tablet, Rfl: 3 .  Coenzyme Q10 (COQ10) 200 MG CAPS, Take by mouth., Disp: , Rfl:  .  esomeprazole (NEXIUM) 20 MG capsule, Take 20 mg by mouth daily at 12 noon., Disp: , Rfl:  .  hydrochlorothiazide (HYDRODIURIL) 25 MG tablet, Take 1 tablet (25 mg total) daily by mouth., Disp: 90 tablet, Rfl: 3 .  meloxicam (MOBIC) 7.5 MG tablet, Take 1 tablet (7.5 mg total) by mouth  daily as needed., Disp: 90 tablet, Rfl: 3 .  metFORMIN (GLUCOPHAGE) 1000 MG tablet, Take 1 tablet (1,000 mg total) by mouth 2 (two) times daily with a meal., Disp: 180 tablet, Rfl: 3 .  sertraline (ZOLOFT) 100 MG tablet, Take 1 tablet (100 mg total) by mouth daily., Disp: 90 tablet, Rfl: 3  Review of Systems  Constitutional: Negative.  Negative for malaise/fatigue.  Eyes: Negative.  Negative for blurred vision.  Respiratory: Negative.  Negative for shortness of breath.   Cardiovascular: Negative.  Negative for chest  pain, palpitations, orthopnea and PND.  Gastrointestinal: Negative.   Endocrine: Negative.   Musculoskeletal: Negative.  Negative for neck pain.  Allergic/Immunologic: Negative.   Neurological: Negative for dizziness, light-headedness, numbness and headaches.  Psychiatric/Behavioral: Negative.     Social History   Tobacco Use  . Smoking status: Never Smoker  . Smokeless tobacco: Never Used  Substance Use Topics  . Alcohol use: Yes    Alcohol/week: 1.0 standard drinks    Types: 1 Glasses of wine per week    Comment: once a month   Objective:   BP 116/72 (BP Location: Left Arm, Patient Position: Sitting, Cuff Size: Normal)   Pulse 83   Temp 98.5 F (36.9 C) (Oral)   Wt 148 lb (67.1 kg)   SpO2 97%   BMI 27.96 kg/m  Vitals:   02/15/18 0822  BP: 116/72  Pulse: 83  Temp: 98.5 F (36.9 C)  TempSrc: Oral  SpO2: 97%  Weight: 148 lb (67.1 kg)     Results for orders placed or performed in visit on 02/15/18  POCT glycosylated hemoglobin (Hb A1C)  Result Value Ref Range   Hemoglobin A1C 6.8 (A) 4.0 - 5.6 %   Diabetic Foot Exam - Simple   Simple Foot Form Diabetic Foot exam was performed with the following findings:  Yes 02/15/2018  8:53 AM  Visual Inspection Sensation Testing Pulse Check Comments      Physical Exam  Constitutional: She is oriented to person, place, and time. She appears well-developed and well-nourished.  HENT:  Head: Normocephalic and atraumatic.  Eyes: Conjunctivae are normal. No scleral icterus.  Neck: No thyromegaly present.  Cardiovascular: Normal rate, regular rhythm and normal heart sounds.  Pulmonary/Chest: Effort normal and breath sounds normal.  Abdominal: Soft.  Musculoskeletal: She exhibits no edema.  Neurological: She is alert and oriented to person, place, and time.  Skin: Skin is warm and dry.  Psychiatric: She has a normal mood and affect. Her behavior is normal. Judgment and thought content normal.        Assessment & Plan:      1. Essential hypertension   2. Hypercholesteremia  3. Type 2 diabetes mellitus without complication, unspecified whether long term insulin use (HCC) A1C Stable at 6.8%.  Continue current medications and recheck in four months.   - POCT glycosylated hemoglobin (Hb A1C)--6.8 today  4. Need for influenza vaccination Flu vaccine given in the office.   - Flu Vaccine QUAD 6+ mos PF IM (Fluarix Quad PF)      I have done the exam and reviewed the above chart and it is accurate to the best of my knowledge. Development worker, community has been used in this note in any air is in the dictation or transcription are unintentional.  [name], MD  [place]

## 2018-04-05 ENCOUNTER — Other Ambulatory Visit: Payer: Self-pay | Admitting: Family Medicine

## 2018-06-22 ENCOUNTER — Ambulatory Visit: Payer: 59 | Admitting: Family Medicine

## 2018-06-22 VITALS — BP 110/70 | HR 96 | Temp 97.7°F | Resp 16 | Wt 148.0 lb

## 2018-06-22 DIAGNOSIS — E7800 Pure hypercholesterolemia, unspecified: Secondary | ICD-10-CM | POA: Diagnosis not present

## 2018-06-22 DIAGNOSIS — I10 Essential (primary) hypertension: Secondary | ICD-10-CM | POA: Diagnosis not present

## 2018-06-22 DIAGNOSIS — E119 Type 2 diabetes mellitus without complications: Secondary | ICD-10-CM | POA: Diagnosis not present

## 2018-06-22 DIAGNOSIS — K219 Gastro-esophageal reflux disease without esophagitis: Secondary | ICD-10-CM

## 2018-06-22 LAB — POCT GLYCOSYLATED HEMOGLOBIN (HGB A1C): Hemoglobin A1C: 6.7 % — AB (ref 4.0–5.6)

## 2018-06-22 NOTE — Progress Notes (Signed)
Andrea Soto  MRN: [id number] DOB: 1960/12/28  Subjective:  HPI   The patient is a 58 year old female who presents for follow up of diabetes.  She was last seen on 02/15/18.  At that time her A1C was 6.8.  Patient states she has not been as good about checking her glucose as she should be.  When she does check it, she has been getting readings that have been around the 130's. She denies any symptoms suggestive of hypoglycemia.  She reports good compliance and tolerance with her medications. Patient is due for a urine micro albumin.  Patient Active Problem List   Diagnosis Date Noted  . 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 Depression, major, single episode, mild ([place]) 10/14/2014  . Acid reflux 10/14/2014  . H/O transient cerebral ischemia 10/14/2014  . Hypercholesteremia 10/14/2014  . BP (high blood pressure) 10/14/2014  . 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 Diabetes mellitus, type 2 ([place]) 10/14/2014    Past Medical History:  Diagnosis Date  . Breast cyst, left    solitary cyst benign  . Depression   . GERD (gastroesophageal reflux disease)   . Hyperlipidemia   . Hypertension     Social History   Socioeconomic History  . Marital status: Married    Spouse name: [name]  . Number of children: 2  . Years of education: 81  . Highest education level: Not on file  Occupational History  . Occupation: [employer]  Social Needs  . Financial resource strain: Not on file  . Food insecurity:    Worry: Not on file    Inability: Not on file  . Transportation needs:    Medical: Not on file    Non-medical: Not on file  Tobacco Use  . Smoking status: Never Smoker  . Smokeless tobacco: Never Used  Substance and Sexual Activity  . Alcohol use: Yes    Alcohol/week: 1.0 standard drinks    Types: 1 Glasses of wine per week    Comment: once a month  . Drug use: No  . Sexual activity: Yes    Birth control/protection: None  Lifestyle  . Physical activity:    Days per week: 3 days    Minutes per  session: 30 min  . Stress: Not at all  Relationships  . Social connections:    Talks on phone: More than three times a week    Gets together: More than three times a week    Attends religious service: More than 4 times per year    Active member of club or organization: Yes    Attends meetings of clubs or organizations: More than 4 times per year    Relationship status: Married  . Intimate partner violence:    Fear of current or ex partner: No    Emotionally abused: No    Physically abused: No    Forced sexual activity: No  Other Topics Concern  . Not on file  Social History Narrative  . Not on file    Outpatient Encounter Medications as of 06/22/2018  Medication Sig Note  . aspirin 81 MG tablet Take by mouth. 10/14/2014: Received from: [facility]  . atorvastatin (LIPITOR) 40 MG tablet TAKE 1 TABLET DAILY BY  MOUTH.   . Coenzyme Q10 (COQ10) 200 MG CAPS Take by mouth. 10/14/2014: Received from: [facility]  . esomeprazole (NEXIUM) 20 MG capsule Take 20 mg by mouth daily at 12 noon.   . hydrochlorothiazide (HYDRODIURIL) 25 MG tablet TAKE 1 TABLET  DAILY   . meloxicam (MOBIC) 7.5 MG tablet Take 1 tablet (7.5 mg total) by mouth daily as needed.   . metFORMIN (GLUCOPHAGE) 1000 MG tablet Take 1 tablet (1,000 mg total) by mouth 2 (two) times daily with a meal.   . sertraline (ZOLOFT) 100 MG tablet Take 1 tablet (100 mg total) by mouth daily.    No facility-administered encounter medications on file as of 06/22/2018.     Allergies  Allergen Reactions  . Augmentin [Amoxicillin-Pot Clavulanate] Diarrhea    Review of Systems  Constitutional: Negative for fever and malaise/fatigue.  HENT: Negative.   Eyes: Negative.   Respiratory: Negative for cough, shortness of breath and wheezing.   Cardiovascular: Negative for chest pain, palpitations, orthopnea, claudication and leg swelling.  Gastrointestinal: Negative.   Genitourinary: Negative for frequency.    Skin: Negative.   Neurological: Negative.   Endo/Heme/Allergies: Negative for polydipsia.  Psychiatric/Behavioral: Negative.     Objective:  BP 110/70 (BP Location: Right Arm, Patient Position: Sitting, Cuff Size: Normal)   Pulse 96   Temp 97.7 F (36.5 C) (Oral)   Resp 16   Wt 148 lb (67.1 kg)   SpO2 96%   BMI 27.96 kg/m   Physical Exam  Constitutional: She is oriented to person, place, and time and well-developed, well-nourished, and in no distress.  HENT:  Head: Normocephalic and atraumatic.  Right Ear: External ear normal.  Left Ear: External ear normal.  Nose: Nose normal.  Eyes: Conjunctivae are normal. No scleral icterus.  Neck: No thyromegaly present.  Cardiovascular: Normal rate and regular rhythm.  Pulmonary/Chest: Effort normal and breath sounds normal.  Abdominal: Soft.  Musculoskeletal:        General: No edema.  Lymphadenopathy:    She has no cervical adenopathy.  Neurological: She is alert and oriented to person, place, and time. Gait normal. GCS score is 15.  Skin: Skin is warm and dry.  Psychiatric: Mood, memory, affect and judgment normal.    Assessment and Plan :  1. Type 2 diabetes mellitus without complication, unspecified whether long term insulin use (HCC) Fairly good control.  Continue lifestyle modification. - POCT glycosylated hemoglobin (Hb A1C)--6.7 today  2. Essential hypertension Controlled.  3. Gastroesophageal reflux disease without esophagitis Controlled.  4. Hypercholesteremia Treated.  I have done the exam and reviewed the chart and it is accurate to the best of my knowledge. Development worker, community has been used and  any errors in dictation or transcription are unintentional. Miguel Aschoff M.D. [facility]

## 2018-10-05 ENCOUNTER — Ambulatory Visit (INDEPENDENT_AMBULATORY_CARE_PROVIDER_SITE_OTHER): Payer: 59 | Admitting: Family Medicine

## 2018-10-05 ENCOUNTER — Other Ambulatory Visit: Payer: Self-pay

## 2018-10-05 ENCOUNTER — Encounter: Payer: Self-pay | Admitting: Family Medicine

## 2018-10-05 VITALS — BP 112/70 | HR 76 | Temp 97.8°F | Resp 16 | Ht 62.0 in | Wt 144.0 lb

## 2018-10-05 DIAGNOSIS — I10 Essential (primary) hypertension: Secondary | ICD-10-CM

## 2018-10-05 DIAGNOSIS — Z1211 Encounter for screening for malignant neoplasm of colon: Secondary | ICD-10-CM | POA: Diagnosis not present

## 2018-10-05 DIAGNOSIS — E7800 Pure hypercholesterolemia, unspecified: Secondary | ICD-10-CM | POA: Diagnosis not present

## 2018-10-05 DIAGNOSIS — E119 Type 2 diabetes mellitus without complications: Secondary | ICD-10-CM | POA: Diagnosis not present

## 2018-10-05 LAB — POCT UA - MICROALBUMIN: Microalbumin Ur, POC: 50 mg/L

## 2018-10-05 NOTE — Patient Instructions (Signed)
Try over the counter daily probiotic daily for diarrhea.

## 2018-10-05 NOTE — Progress Notes (Signed)
Patient: Andrea Soto, Female    DOB: 06/25/1960, 58 y.o.   MRN: [id number] Visit Date: 10/05/2018  Today's Provider: [name], MD   Chief Complaint  Patient presents with  . Annual Exam   Subjective:     Annual physical exam Andrea Soto is a 58 y.o. female who presents today for health maintenance and complete physical.  She feels fairly well.  Pt states she has been having trouble with recurring diarrhea.  She states it has been coming and going over the course of several months.  The diarrhea occurs about twice per week.  She reports not exercising much, but reports walking some. She reports she is sleeping well.  -----------------------------------------------------------------   Review of Systems  Constitutional: Negative.   HENT: Negative.   Eyes: Negative.   Respiratory: Negative.   Cardiovascular: Negative.   Gastrointestinal: Positive for diarrhea. Negative for abdominal distention, abdominal pain, anal bleeding, blood in stool, constipation, nausea, rectal pain and vomiting.  Endocrine: Negative.   Genitourinary: Negative.   Musculoskeletal: Negative.   Skin: Negative.   Allergic/Immunologic: Negative.   Neurological: Negative.   Hematological: Negative.   Psychiatric/Behavioral: Negative.     Social History      She  reports that she has never smoked. She has never used smokeless tobacco. She reports current alcohol use of about 1.0 standard drinks of alcohol per week. She reports that she does not use drugs.       Social History   Socioeconomic History  . Marital status: Married    Spouse name: [name]  . Number of children: 2  . Years of education: 74  . Highest education level: Not on file  Occupational History  . Occupation: [employer]  Social Needs  . Financial resource strain: Not on file  . Food insecurity:    Worry: Not on file    Inability: Not on file  . Transportation needs:    Medical: Not on file    Non-medical: Not on file   Tobacco Use  . Smoking status: Never Smoker  . Smokeless tobacco: Never Used  Substance and Sexual Activity  . Alcohol use: Yes    Alcohol/week: 1.0 standard drinks    Types: 1 Glasses of wine per week    Comment: once a month  . Drug use: No  . Sexual activity: Yes    Birth control/protection: None  Lifestyle  . Physical activity:    Days per week: 3 days    Minutes per session: 30 min  . Stress: Not at all  Relationships  . Social connections:    Talks on phone: More than three times a week    Gets together: More than three times a week    Attends religious service: More than 4 times per year    Active member of club or organization: Yes    Attends meetings of clubs or organizations: More than 4 times per year    Relationship status: Married  Other Topics Concern  . Not on file  Social History Narrative  . Not on file    Past Medical History:  Diagnosis Date  . Breast cyst, left    solitary cyst benign  . Depression   . GERD (gastroesophageal reflux disease)   . Hyperlipidemia   . Hypertension      Patient Active Problem List   Diagnosis Date Noted  . 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 Depression, major, single episode, mild ([place]) 10/14/2014  . Acid  reflux 10/14/2014  . H/O transient cerebral ischemia 10/14/2014  . Hypercholesteremia 10/14/2014  . BP (high blood pressure) 10/14/2014  . 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 Diabetes mellitus, type 2 ([place]) 10/14/2014    Past Surgical History:  Procedure Laterality Date  . BREAST BIOPSY  1978  . ENDOMETRIAL BIOPSY    . HYSTEROSCOPY  03/04/2006    Family History        Family Status  Relation Name Status  . Mother  Alive  . Father  Deceased at age 11  . Sister  Alive  . Daughter  Alive  . Son  Alive  . Brother  Alive  . PGM  Deceased        Her family history includes Allergic rhinitis in her mother and son; Bone cancer in her paternal grandmother; Breast cancer (age of onset: 21) in her paternal grandmother;  Cancer in her father; Epilepsy in her daughter; Heart attack in her father; Hypertension in her mother and sister; Multiple myeloma in her father; Seizures in her daughter.      Allergies  Allergen Reactions  . Augmentin [Amoxicillin-Pot Clavulanate] Diarrhea     Current Outpatient Medications:  .  aspirin 81 MG tablet, Take by mouth., Disp: , Rfl:  .  atorvastatin (LIPITOR) 40 MG tablet, TAKE 1 TABLET DAILY BY  MOUTH., Disp: 90 tablet, Rfl: 3 .  Coenzyme Q10 (COQ10) 200 MG CAPS, Take by mouth., Disp: , Rfl:  .  esomeprazole (NEXIUM) 20 MG capsule, Take 20 mg by mouth daily at 12 noon., Disp: , Rfl:  .  hydrochlorothiazide (HYDRODIURIL) 25 MG tablet, TAKE 1 TABLET DAILY, Disp: 90 tablet, Rfl: 3 .  meloxicam (MOBIC) 7.5 MG tablet, Take 1 tablet (7.5 mg total) by mouth daily as needed., Disp: 90 tablet, Rfl: 3 .  metFORMIN (GLUCOPHAGE) 1000 MG tablet, Take 1 tablet (1,000 mg total) by mouth 2 (two) times daily with a meal., Disp: 180 tablet, Rfl: 3 .  sertraline (ZOLOFT) 100 MG tablet, Take 1 tablet (100 mg total) by mouth daily., Disp: 90 tablet, Rfl: 3   Patient Care Team: Jerrol Banana., MD as PCP - General (Family Medicine)    Objective:    Vitals: BP 112/70 (BP Location: Right Arm, Patient Position: Sitting, Cuff Size: Normal)   Pulse 76   Temp 97.8 F (36.6 C) (Oral)   Resp 16   Ht '5\' 2"'  (1.575 m)   Wt 144 lb (65.3 kg)   BMI 26.34 kg/m    Vitals:   10/05/18 0936  BP: 112/70  Pulse: 76  Resp: 16  Temp: 97.8 F (36.6 C)  TempSrc: Oral  Weight: 144 lb (65.3 kg)  Height: '5\' 2"'  (1.575 m)     Physical Exam Vitals signs reviewed.  Constitutional:      Appearance: She is well-developed.  HENT:     Head: Normocephalic and atraumatic.     Right Ear: External ear normal.     Left Ear: External ear normal.     Nose: Nose normal.  Eyes:     General: No scleral icterus.    Conjunctiva/sclera: Conjunctivae normal.  Neck:     Thyroid: No thyromegaly.   Cardiovascular:     Rate and Rhythm: Normal rate and regular rhythm.     Heart sounds: Normal heart sounds.  Pulmonary:     Effort: Pulmonary effort is normal.     Breath sounds: Normal breath sounds.  Chest:     Breasts:  Right: Normal.        Left: Normal.  Abdominal:     Palpations: Abdomen is soft.  Lymphadenopathy:     Cervical: No cervical adenopathy.  Skin:    General: Skin is warm and dry.  Neurological:     Mental Status: She is alert and oriented to person, place, and time.  Psychiatric:        Behavior: Behavior normal.        Thought Content: Thought content normal.        Judgment: Judgment normal.      Depression Screen PHQ 2/9 Scores 10/05/2018 06/11/2017 02/09/2017 01/29/2016  PHQ - 2 Score 0 0 0 0  PHQ- 9 Score 0 0 1 -       Assessment & Plan:     Routine Health Maintenance and Physical Exam  Exercise Activities and Dietary recommendations Goals   None     Immunization History  Administered Date(s) Administered  . Influenza,inj,Quad PF,6+ Mos 03/08/2015, 01/29/2016, 02/09/2017, 02/15/2018  . Pneumococcal Polysaccharide-23 04/08/2012  . Tdap 07/21/2007    Health Maintenance  Topic Date Due  . HIV Screening  06/12/1973  . TETANUS/TDAP  07/20/2017  . URINE MICROALBUMIN  02/09/2018  . OPHTHALMOLOGY EXAM  08/07/2018  . COLONOSCOPY  09/22/2018  . HEMOGLOBIN A1C  12/21/2018  . INFLUENZA VACCINE  12/25/2018  . FOOT EXAM  02/16/2019  . MAMMOGRAM  08/08/2019  . PAP SMEAR-Modifier  07/03/2020  . PNEUMOCOCCAL POLYSACCHARIDE VACCINE AGE 10-64 HIGH RISK  Completed  . Hepatitis C Screening  Completed     Discussed health benefits of physical activity, and encouraged her to engage in regular exercise appropriate for her age and condition.  Last screening colonoscopy 09/21/2008.  Refer to GI.  S Pap smear February 8,2019. RTC 4 months.   --------------------------------------------------------------------   I have done the exam and reviewed  the above chart and it is accurate to the best of my knowledge. Development worker, community has been used in this note in any air is in the dictation or transcription are unintentional.  [name], MD  [place]

## 2018-10-06 ENCOUNTER — Telehealth: Payer: Self-pay

## 2018-10-06 ENCOUNTER — Other Ambulatory Visit: Payer: Self-pay

## 2018-10-06 DIAGNOSIS — Z01812 Encounter for preprocedural laboratory examination: Secondary | ICD-10-CM

## 2018-10-06 DIAGNOSIS — Z1211 Encounter for screening for malignant neoplasm of colon: Secondary | ICD-10-CM

## 2018-10-06 LAB — COMPREHENSIVE METABOLIC PANEL
ALT: 23 IU/L (ref 0–32)
AST: 20 IU/L (ref 0–40)
Albumin/Globulin Ratio: 2 (ref 1.2–2.2)
Albumin: 4.7 g/dL (ref 3.8–4.9)
Alkaline Phosphatase: 76 IU/L (ref 39–117)
BUN/Creatinine Ratio: 22 (ref 12–28)
BUN: 16 mg/dL (ref 8–27)
Bilirubin Total: 0.2 mg/dL (ref 0.0–1.2)
CO2: 26 mmol/L (ref 20–29)
Calcium: 9.3 mg/dL (ref 8.7–10.3)
Chloride: 98 mmol/L (ref 96–106)
Creatinine, Ser: 0.74 mg/dL (ref 0.57–1.00)
GFR calc Af Amer: 102 mL/min/{1.73_m2} (ref >59)
GFR calc non Af Amer: 88 mL/min/{1.73_m2} (ref >59)
Globulin, Total: 2.3 g/dL (ref 1.5–4.5)
Glucose: 106 mg/dL — ABNORMAL HIGH (ref 65–99)
Potassium: 3.8 mmol/L (ref 3.5–5.2)
Sodium: 141 mmol/L (ref 134–144)
Total Protein: 7 g/dL (ref 6.0–8.5)

## 2018-10-06 LAB — CBC WITH DIFFERENTIAL/PLATELET
Basophils Absolute: 0.1 10*3/uL (ref 0.0–0.2)
Basos: 1 %
EOS (ABSOLUTE): 0.3 10*3/uL (ref 0.0–0.4)
Eos: 3 %
Hematocrit: 40 % (ref 34.0–46.6)
Hemoglobin: 13.4 g/dL (ref 11.1–15.9)
Immature Grans (Abs): 0 10*3/uL (ref 0.0–0.1)
Immature Granulocytes: 0 %
Lymphocytes Absolute: 2.2 10*3/uL (ref 0.7–3.1)
Lymphs: 25 %
MCH: 27.4 pg (ref 26.6–33.0)
MCHC: 33.5 g/dL (ref 31.5–35.7)
MCV: 82 fL (ref 79–97)
Monocytes Absolute: 0.6 10*3/uL (ref 0.1–0.9)
Monocytes: 7 %
Neutrophils Absolute: 5.4 10*3/uL (ref 1.4–7.0)
Neutrophils: 64 %
Platelets: 289 10*3/uL (ref 150–450)
RBC: 4.89 x10E6/uL (ref 3.77–5.28)
RDW: 13 % (ref 11.7–15.4)
WBC: 8.5 10*3/uL (ref 3.4–10.8)

## 2018-10-06 LAB — LIPID PANEL
Chol/HDL Ratio: 4.3 ratio (ref 0.0–4.4)
Cholesterol, Total: 152 mg/dL (ref 100–199)
HDL: 35 mg/dL — ABNORMAL LOW (ref >39)
LDL Calculated: 76 mg/dL (ref 0–99)
Triglycerides: 206 mg/dL — ABNORMAL HIGH (ref 0–149)
VLDL Cholesterol Cal: 41 mg/dL — ABNORMAL HIGH (ref 5–40)

## 2018-10-06 LAB — HEMOGLOBIN A1C
Est. average glucose Bld gHb Est-mCnc: 148 mg/dL
Hgb A1c MFr Bld: 6.8 % — ABNORMAL HIGH (ref 4.8–5.6)

## 2018-10-06 LAB — TSH: TSH: 2.51 u[IU]/mL (ref 0.450–4.500)

## 2018-10-06 MED ORDER — NA SULFATE-K SULFATE-MG SULF 17.5-3.13-1.6 GM/177ML PO SOLN: 1.0000 | Freq: Once | ORAL | 0 refills | Status: AC

## 2018-10-06 NOTE — Telephone Encounter (Signed)
Gastroenterology Pre-Procedure Review  Request Date: May 29th Requesting Physician: Dr. Allen Norris  PATIENT REVIEW QUESTIONS: The patient responded to the following health history questions as indicated:    1. Are you having any GI issues? Occasional Diarrhea 2. Do you have a personal history of Polyps? No 3. Do you have a family history of Colon Cancer or Polyps?No 4. Diabetes Mellitus? Yes, takes Metformin 5. Joint replacements in the past 12 months?No 6. Major health problems in the past 3 months?No 7. Any artificial heart valves, MVP, or defibrillator?No    MEDICATIONS & ALLERGIES:    Patient reports the following regarding taking any anticoagulation/antiplatelet therapy:   Plavix, Coumadin, Eliquis, Xarelto, Lovenox, Pradaxa, Brilinta, or Effient? No Aspirin? No  Patient confirms/reports the following medications:  Current Outpatient Medications  Medication Sig Dispense Refill  . aspirin 81 MG tablet Take by mouth.    Marland Kitchen atorvastatin (LIPITOR) 40 MG tablet TAKE 1 TABLET DAILY BY  MOUTH. 90 tablet 3  . Coenzyme Q10 (COQ10) 200 MG CAPS Take by mouth.    . esomeprazole (NEXIUM) 20 MG capsule Take 20 mg by mouth daily at 12 noon.    . hydrochlorothiazide (HYDRODIURIL) 25 MG tablet TAKE 1 TABLET DAILY 90 tablet 3  . meloxicam (MOBIC) 7.5 MG tablet Take 1 tablet (7.5 mg total) by mouth daily as needed. 90 tablet 3  . metFORMIN (GLUCOPHAGE) 1000 MG tablet Take 1 tablet (1,000 mg total) by mouth 2 (two) times daily with a meal. 180 tablet 3  . sertraline (ZOLOFT) 100 MG tablet Take 1 tablet (100 mg total) by mouth daily. 90 tablet 3   No current facility-administered medications for this visit.     Patient confirms/reports the following allergies:  Allergies  Allergen Reactions  . Augmentin [Amoxicillin-Pot Clavulanate] Diarrhea    No orders of the defined types were placed in this encounter.   AUTHORIZATION INFORMATION Primary Insurance: 1D#: Group #:  Secondary  Insurance: 1D#: Group #:  SCHEDULE INFORMATION: Date: 10/22/18 Time: Location:[facility]

## 2018-10-06 NOTE — Telephone Encounter (Signed)
Patient was advised.  

## 2018-10-06 NOTE — Telephone Encounter (Signed)
-----   Message from Jerrol Banana., MD sent at 10/06/2018  8:25 AM EDT ----- Stable--continue to work on lifestyle.

## 2018-10-15 ENCOUNTER — Other Ambulatory Visit: Payer: Self-pay

## 2018-10-15 ENCOUNTER — Other Ambulatory Visit
Admission: RE | Admit: 2018-10-15 | Discharge: 2018-10-15 | Disposition: A | Discharge status: Home / Self Care | Payer: 59 | Source: Ambulatory Visit | Attending: Gastroenterology | Admitting: Gastroenterology

## 2018-10-15 DIAGNOSIS — Z1159 Encounter for screening for other viral diseases: Secondary | ICD-10-CM | POA: Insufficient documentation

## 2018-10-16 LAB — NOVEL CORONAVIRUS, NAA (HOSP ORDER, SEND-OUT TO REF LAB; TAT 18-24 HRS): SARS-CoV-2, NAA: NOT DETECTED

## 2018-10-19 ENCOUNTER — Other Ambulatory Visit: Payer: 59

## 2018-10-19 ENCOUNTER — Encounter: Payer: Self-pay | Admitting: *Deleted

## 2018-10-19 ENCOUNTER — Other Ambulatory Visit: Payer: Self-pay

## 2018-10-19 DIAGNOSIS — E119 Type 2 diabetes mellitus without complications: Secondary | ICD-10-CM | POA: Diagnosis not present

## 2018-10-19 LAB — HM DIABETES EYE EXAM

## 2018-10-20 ENCOUNTER — Telehealth: Payer: Self-pay | Admitting: Gastroenterology

## 2018-10-20 NOTE — Telephone Encounter (Signed)
Kizzy from [facility] center is calling to check on pre-authorization on pt procedure 10/22/18 with Dr. Allen Norris cb [phone]

## 2018-10-22 ENCOUNTER — Ambulatory Visit: Payer: 59 | Admitting: Anesthesiology

## 2018-10-22 ENCOUNTER — Ambulatory Visit
Admission: RE | Admit: 2018-10-22 | Discharge: 2018-10-22 | Disposition: A | Discharge status: Home / Self Care | Payer: 59 | Attending: Gastroenterology | Admitting: Gastroenterology

## 2018-10-22 ENCOUNTER — Encounter
Admission: RE | Disposition: A | Discharge status: Home / Self Care | Payer: Self-pay | Source: Home / Self Care | Attending: Gastroenterology

## 2018-10-22 ENCOUNTER — Other Ambulatory Visit: Payer: Self-pay

## 2018-10-22 DIAGNOSIS — Z1211 Encounter for screening for malignant neoplasm of colon: Secondary | ICD-10-CM | POA: Diagnosis present

## 2018-10-22 DIAGNOSIS — Z7982 Long term (current) use of aspirin: Secondary | ICD-10-CM | POA: Insufficient documentation

## 2018-10-22 DIAGNOSIS — D120 Benign neoplasm of cecum: Secondary | ICD-10-CM | POA: Diagnosis not present

## 2018-10-22 DIAGNOSIS — Z8249 Family history of ischemic heart disease and other diseases of the circulatory system: Secondary | ICD-10-CM | POA: Diagnosis not present

## 2018-10-22 DIAGNOSIS — Z809 Family history of malignant neoplasm, unspecified: Secondary | ICD-10-CM | POA: Insufficient documentation

## 2018-10-22 DIAGNOSIS — Z79899 Other long term (current) drug therapy: Secondary | ICD-10-CM | POA: Insufficient documentation

## 2018-10-22 DIAGNOSIS — Z7984 Long term (current) use of oral hypoglycemic drugs: Secondary | ICD-10-CM | POA: Insufficient documentation

## 2018-10-22 DIAGNOSIS — I10 Essential (primary) hypertension: Secondary | ICD-10-CM | POA: Diagnosis not present

## 2018-10-22 DIAGNOSIS — M1990 Unspecified osteoarthritis, unspecified site: Secondary | ICD-10-CM | POA: Diagnosis not present

## 2018-10-22 DIAGNOSIS — E785 Hyperlipidemia, unspecified: Secondary | ICD-10-CM | POA: Insufficient documentation

## 2018-10-22 DIAGNOSIS — M19011 Primary osteoarthritis, right shoulder: Secondary | ICD-10-CM | POA: Diagnosis not present

## 2018-10-22 DIAGNOSIS — K219 Gastro-esophageal reflux disease without esophagitis: Secondary | ICD-10-CM | POA: Diagnosis not present

## 2018-10-22 DIAGNOSIS — Z803 Family history of malignant neoplasm of breast: Secondary | ICD-10-CM | POA: Insufficient documentation

## 2018-10-22 DIAGNOSIS — M19012 Primary osteoarthritis, left shoulder: Secondary | ICD-10-CM | POA: Insufficient documentation

## 2018-10-22 DIAGNOSIS — F329 Major depressive disorder, single episode, unspecified: Secondary | ICD-10-CM | POA: Diagnosis not present

## 2018-10-22 DIAGNOSIS — K641 Second degree hemorrhoids: Secondary | ICD-10-CM | POA: Insufficient documentation

## 2018-10-22 DIAGNOSIS — E119 Type 2 diabetes mellitus without complications: Secondary | ICD-10-CM | POA: Diagnosis not present

## 2018-10-22 DIAGNOSIS — Z820 Family history of epilepsy and other diseases of the nervous system: Secondary | ICD-10-CM | POA: Diagnosis not present

## 2018-10-22 DIAGNOSIS — Z791 Long term (current) use of non-steroidal anti-inflammatories (NSAID): Secondary | ICD-10-CM | POA: Diagnosis not present

## 2018-10-22 HISTORY — DX: Type 2 diabetes mellitus without complications: E11.9

## 2018-10-22 HISTORY — DX: Family history of other specified conditions: Z84.89

## 2018-10-22 HISTORY — DX: Unspecified osteoarthritis, unspecified site: M19.90

## 2018-10-22 HISTORY — PX: POLYPECTOMY: SHX5525

## 2018-10-22 HISTORY — PX: COLONOSCOPY WITH PROPOFOL: SHX5780

## 2018-10-22 LAB — GLUCOSE, CAPILLARY
Glucose-Capillary: 145 mg/dL — ABNORMAL HIGH (ref 70–99)
Glucose-Capillary: 128 mg/dL — ABNORMAL HIGH (ref 70–99)

## 2018-10-22 SURGERY — COLONOSCOPY WITH PROPOFOL
Anesthesia: General | Site: Rectum

## 2018-10-22 MED ORDER — PROPOFOL 10 MG/ML IV BOLUS
INTRAVENOUS | Status: DC | PRN
  Administered 2018-10-22: 40 mg via INTRAVENOUS
  Administered 2018-10-22: 100 mg via INTRAVENOUS
  Administered 2018-10-22: 20 mg via INTRAVENOUS
  Administered 2018-10-22: 30 mg via INTRAVENOUS
  Administered 2018-10-22: 50 mg via INTRAVENOUS
  Administered 2018-10-22 (×2): 40 mg via INTRAVENOUS

## 2018-10-22 MED ORDER — ONDANSETRON HCL 4 MG/2ML IJ SOLN
INTRAMUSCULAR | Status: DC | PRN
  Administered 2018-10-22: 4 mg via INTRAVENOUS

## 2018-10-22 MED ORDER — ACETAMINOPHEN 160 MG/5ML PO SOLN: 325.0000 mg | Freq: Once | ORAL | Status: DC

## 2018-10-22 MED ORDER — STERILE WATER FOR IRRIGATION IR SOLN
Status: DC | PRN
  Administered 2018-10-22: 09:00:00

## 2018-10-22 MED ORDER — ACETAMINOPHEN 325 MG PO TABS: 325.0000 mg | ORAL_TABLET | Freq: Once | ORAL | Status: DC

## 2018-10-22 MED ORDER — LACTATED RINGERS IV SOLN
INTRAVENOUS | Status: DC
  Administered 2018-10-22: 08:00:00 via INTRAVENOUS

## 2018-10-22 MED ORDER — LIDOCAINE HCL (CARDIAC) PF 100 MG/5ML IV SOSY
PREFILLED_SYRINGE | INTRAVENOUS | Status: DC | PRN
  Administered 2018-10-22: 30 mg via INTRAVENOUS

## 2018-10-22 SURGICAL SUPPLY — 8 items
CANISTER SUCT 1200ML W/VALVE (MISCELLANEOUS) ×2 IMPLANT
FORCEPS BIOP RAD 4 LRG CAP 4 (CUTTING FORCEPS) ×2 IMPLANT
GOWN CVR UNV OPN BCK APRN NK (MISCELLANEOUS) ×2 IMPLANT
GOWN ISOL THUMB LOOP REG UNIV (MISCELLANEOUS) ×2
KIT ENDO PROCEDURE OLY (KITS) ×2 IMPLANT
SNARE SHORT THROW 13M SML OVAL (MISCELLANEOUS) ×2 IMPLANT
TRAP ETRAP POLY (MISCELLANEOUS) ×2 IMPLANT
WATER STERILE IRR 250ML POUR (IV SOLUTION) ×2 IMPLANT

## 2018-10-22 NOTE — Anesthesia Preprocedure Evaluation (Signed)
Anesthesia Evaluation  Patient identified by MRN, date of birth, ID band Patient awake    Reviewed: Allergy & Precautions, H&P , NPO status , Patient's Chart, lab work & pertinent test results  Airway Mallampati: III  TM Distance: >3 FB Neck ROM: full    Dental no notable dental hx.    Pulmonary    Pulmonary exam normal breath sounds clear to auscultation       Cardiovascular hypertension, Normal cardiovascular exam Rhythm:regular Rate:Normal     Neuro/Psych    GI/Hepatic GERD  ,  Endo/Other  diabetes  Renal/GU      Musculoskeletal   Abdominal   Peds  Hematology   Anesthesia Other Findings   Reproductive/Obstetrics                             Anesthesia Physical Anesthesia Plan  ASA: II  Anesthesia Plan: General   Post-op Pain Management:    Induction: Intravenous  PONV Risk Score and Plan: 3 and Propofol infusion and Treatment may vary due to age or medical condition  Airway Management Planned: Natural Airway  Additional Equipment:   Intra-op Plan:   Post-operative Plan:   Informed Consent: I have reviewed the patients History and Physical, chart, labs and discussed the procedure including the risks, benefits and alternatives for the proposed anesthesia with the patient or authorized representative who has indicated his/her understanding and acceptance.       Plan Discussed with: CRNA  Anesthesia Plan Comments:         Anesthesia Quick Evaluation

## 2018-10-22 NOTE — Transfer of Care (Signed)
Immediate Anesthesia Transfer of Care Note  Patient: Andrea Soto  Procedure(s) Performed: COLONOSCOPY WITH PROPOFOL (N/A Rectum) POLYPECTOMY (N/A Rectum)  Patient Location: PACU  Anesthesia Type: General  Level of Consciousness: awake, alert  and patient cooperative  Airway and Oxygen Therapy: Patient Spontanous Breathing and Patient connected to supplemental oxygen  Post-op Assessment: Post-op Vital signs reviewed, Patient's Cardiovascular Status Stable, Respiratory Function Stable, Patent Airway and No signs of Nausea or vomiting  Post-op Vital Signs: Reviewed and stable  Complications: No apparent anesthesia complications

## 2018-10-22 NOTE — Anesthesia Procedure Notes (Signed)
Date/Time: 10/22/2018 8:53 AM Performed by: Cameron Ali, CRNA Pre-anesthesia Checklist: Patient identified, Emergency Drugs available, Suction available, Timeout performed and Patient being monitored Patient Re-evaluated:Patient Re-evaluated prior to induction Oxygen Delivery Method: Nasal cannula Placement Confirmation: positive ETCO2

## 2018-10-22 NOTE — H&P (Signed)
[name], MD [address] [place], [place] [address] Phone: [phone] Fax : [phone]  Primary Care Physician:  Jerrol Banana., MD Primary Gastroenterologist:  Dr. Allen Norris  Pre-Procedure History & Physical: HPI:  Andrea Soto is a 58 y.o. female is here for a screening colonoscopy.   Past Medical History:  Diagnosis Date  . Arthritis    shoulders  . Breast cyst, left    solitary cyst benign  . Depression   . Diabetes mellitus, type 2 ([place])   . Family history of adverse reaction to anesthesia    Son - PONV  . GERD (gastroesophageal reflux disease)   . Hyperlipidemia   . Hypertension     Past Surgical History:  Procedure Laterality Date  . BREAST BIOPSY  1978  . ENDOMETRIAL BIOPSY    . HYSTEROSCOPY  03/04/2006    Prior to Admission medications   Medication Sig Start Date End Date Taking? Authorizing Provider  aspirin 81 MG tablet Take by mouth.   Yes [provider]  atorvastatin (LIPITOR) 40 MG tablet TAKE 1 TABLET DAILY BY  MOUTH. 04/06/18  Yes Jerrol Banana., MD  Coenzyme Q10 (COQ10) 200 MG CAPS Take by mouth. 04/02/11  Yes [provider]  Cyanocobalamin (VITAMIN B-12 PO) Take by mouth.   Yes [provider]  esomeprazole (NEXIUM) 20 MG capsule Take 20 mg by mouth daily at 12 noon.   Yes [provider]  hydrochlorothiazide (HYDRODIURIL) 25 MG tablet TAKE 1 TABLET DAILY 04/06/18  Yes Jerrol Banana., MD  meloxicam (MOBIC) 7.5 MG tablet Take 1 tablet (7.5 mg total) by mouth daily as needed. 06/01/17  Yes Jerrol Banana., MD  metFORMIN (GLUCOPHAGE) 1000 MG tablet Take 1 tablet (1,000 mg total) by mouth 2 (two) times daily with a meal. 12/15/17  Yes Jerrol Banana., MD  sertraline (ZOLOFT) 100 MG tablet Take 1 tablet (100 mg total) by mouth daily. 12/15/17  Yes Jerrol Banana., MD    Allergies as of 10/06/2018 - Review Complete 10/05/2018  Allergen Reaction Noted  .  Augmentin [amoxicillin-pot clavulanate] Diarrhea 11/05/2016    Family History  Problem Relation Age of Onset  . Hypertension Mother   . Allergic rhinitis Mother   . Cancer Father        multiple myeloma and bladder cancer.  Marland Kitchen Heart attack Father   . Multiple myeloma Father   . Hypertension Sister   . Epilepsy Daughter   . Seizures Daughter   . Allergic rhinitis Son   . Breast cancer Paternal Grandmother 105  . Bone cancer Paternal Grandmother     Social History   Socioeconomic History  . Marital status: Married    Spouse name: [name]  . Number of children: 2  . Years of education: 2  . Highest education level: Not on file  Occupational History  . Occupation: [employer]  Social Needs  . Financial resource strain: Not on file  . Food insecurity:    Worry: Not on file    Inability: Not on file  . Transportation needs:    Medical: Not on file    Non-medical: Not on file  Tobacco Use  . Smoking status: Never Smoker  . Smokeless tobacco: Never Used  Substance and Sexual Activity  . Alcohol use: Yes    Alcohol/week: 1.0 standard drinks    Types: 1 Glasses of wine per week    Comment: once a month  .  Drug use: No  . Sexual activity: Yes    Birth control/protection: None  Lifestyle  . Physical activity:    Days per week: 3 days    Minutes per session: 30 min  . Stress: Not at all  Relationships  . Social connections:    Talks on phone: More than three times a week    Gets together: More than three times a week    Attends religious service: More than 4 times per year    Active member of club or organization: Yes    Attends meetings of clubs or organizations: More than 4 times per year    Relationship status: Married  . Intimate partner violence:    Fear of current or ex partner: No    Emotionally abused: No    Physically abused: No    Forced sexual activity: No  Other Topics Concern  . Not on file  Social History Narrative  . Not on file    Review of Systems:  See HPI, otherwise negative ROS  Physical Exam: BP 121/88   Pulse 89   Temp 97.7 F (36.5 C) (Temporal)   Ht '5\' 2"'  (1.575 m)   Wt 63 kg   SpO2 98%   BMI 25.42 kg/m  General:   Alert,  pleasant and cooperative in NAD Head:  Normocephalic and atraumatic. Neck:  Supple; no masses or thyromegaly. Lungs:  Clear throughout to auscultation.    Heart:  Regular rate and rhythm. Abdomen:  Soft, nontender and nondistended. Normal bowel sounds, without guarding, and without rebound.   Neurologic:  Alert and  oriented x4;  grossly normal neurologically.  Impression/Plan: Andrea Soto is now here to undergo a screening colonoscopy.  Risks, benefits, and alternatives regarding colonoscopy have been reviewed with the patient.  Questions have been answered.  All parties agreeable.

## 2018-10-22 NOTE — Anesthesia Postprocedure Evaluation (Signed)
Anesthesia Post Note  Patient: [name]  Procedure(s) Performed: COLONOSCOPY WITH PROPOFOL (N/A Rectum) POLYPECTOMY (N/A Rectum)  Patient location during evaluation: PACU Anesthesia Type: General Level of consciousness: awake and alert and oriented Pain management: satisfactory to patient Vital Signs Assessment: post-procedure vital signs reviewed and stable Respiratory status: spontaneous breathing, nonlabored ventilation and respiratory function stable Cardiovascular status: blood pressure returned to baseline and stable Postop Assessment: Adequate PO intake and No signs of nausea or vomiting Anesthetic complications: no    Raliegh Ip

## 2018-10-22 NOTE — Op Note (Addendum)
[facility] Gastroenterology Patient Name: Andrea Soto Procedure Date: 10/22/2018 8:38 AM MRN: [id number] Account #: [id number] Date of Birth: [birth year] Admit Type: Ambulatory Age: 58 Room: [facility] OR ROOM 01 Gender: Female Note Status: Finalized Procedure:            Colonoscopy Indications:          Screening for colorectal malignant neoplasm Providers:            [name] MD, MD Referring MD:         Janine Ores. Rosanna Randy, MD (Referring MD) Medicines:            Propofol per Anesthesia Complications:        No immediate complications. Procedure:            Pre-Anesthesia Assessment:                       - Prior to the procedure, a History and Physical was                        performed, and patient medications and allergies were                        reviewed. The patient's tolerance of previous                        anesthesia was also reviewed. The risks and benefits of                        the procedure and the sedation options and risks were                        discussed with the patient. All questions were                        answered, and informed consent was obtained. Prior                        Anticoagulants: The patient has taken no previous                        anticoagulant or antiplatelet agents. ASA Grade                        Assessment: II - A patient with mild systemic disease.                        After reviewing the risks and benefits, the patient was                        deemed in satisfactory condition to undergo the                        procedure.                       After obtaining informed consent, the colonoscope was                        passed under direct vision. Throughout the procedure,  the patient's blood pressure, pulse, and oxygen                        saturations were monitored continuously. The                        Colonoscope was introduced through the anus and            advanced to the the cecum, identified by appendiceal                        orifice and ileocecal valve. The colonoscopy was                        performed without difficulty. The patient tolerated the                        procedure well. The quality of the bowel preparation                        was excellent. Findings:      The perianal and digital rectal examinations were normal.      A 6 mm polyp was found in the cecum. The polyp was sessile. The polyp       was removed with a cold snare. Resection and retrieval were complete.      A 2 mm polyp was found in the appendiceal orifice. The polyp was       sessile. The polyp was removed with a cold biopsy forceps. Resection and       retrieval were complete.      Non-bleeding internal hemorrhoids were found during retroflexion. The       hemorrhoids were Grade II (internal hemorrhoids that prolapse but reduce       spontaneously).      Biopsies were taken with a cold forceps in the entire colon for       histology. Impression:           - One 6 mm polyp in the cecum, removed with a cold                        snare. Resected and retrieved.                       - One 2 mm polyp at the appendiceal orifice, removed                        with a cold biopsy forceps. Resected and retrieved.                       - Non-bleeding internal hemorrhoids.                       - Biopsies were taken with a cold forceps for histology                        in the entire colon. Recommendation:       - Discharge patient to home.                       - Resume previous diet.                       -  Continue present medications.                       - Await pathology results.                       - Repeat colonoscopy in 5 years if polyp adenoma and 10                        years if hyperplastic Procedure Code(s):    --- Professional ---                       [phone], Colonoscopy, flexible; with removal of tumor(s),                         polyp(s), or other lesion(s) by snare technique                       [id number], 37, Colonoscopy, flexible; with biopsy, single                        or multiple Diagnosis Code(s):    --- Professional ---                       Z12.11, Encounter for screening for malignant neoplasm                        of colon                       K63.5, Polyp of colon CPT copyright 2019 American Medical Association. All rights reserved. The codes documented in this report are preliminary and upon coder review may  be revised to meet current compliance requirements. [name] MD, MD 10/22/2018 9:10:56 AM This report has been signed electronically. Number of Addenda: 0 Note Initiated On: 10/22/2018 8:38 AM Scope Withdrawal Time: 0 hours 8 minutes 24 seconds  Total Procedure Duration: 0 hours 12 minutes 6 seconds       [facility]

## 2018-10-22 NOTE — OR Nursing (Deleted)
Before patient was awake and left room, MD realized we needed to take random colon biospies.  New scope used to collect samples.

## 2018-10-27 ENCOUNTER — Encounter: Payer: Self-pay | Admitting: Gastroenterology

## 2018-11-24 ENCOUNTER — Encounter: Payer: Self-pay | Admitting: Obstetrics and Gynecology

## 2018-11-24 ENCOUNTER — Other Ambulatory Visit: Payer: Self-pay

## 2018-11-24 ENCOUNTER — Other Ambulatory Visit: Payer: Self-pay | Admitting: Family Medicine

## 2018-11-24 ENCOUNTER — Ambulatory Visit (INDEPENDENT_AMBULATORY_CARE_PROVIDER_SITE_OTHER): Payer: 59 | Admitting: Obstetrics and Gynecology

## 2018-11-24 VITALS — BP 112/62 | HR 95 | Ht 62.0 in | Wt 147.0 lb

## 2018-11-24 DIAGNOSIS — Z01419 Encounter for gynecological examination (general) (routine) without abnormal findings: Secondary | ICD-10-CM

## 2018-11-24 DIAGNOSIS — E119 Type 2 diabetes mellitus without complications: Secondary | ICD-10-CM

## 2018-11-24 DIAGNOSIS — Z1231 Encounter for screening mammogram for malignant neoplasm of breast: Secondary | ICD-10-CM

## 2018-11-24 NOTE — Progress Notes (Signed)
Gynecology Annual Exam  PCP: Jerrol Banana., MD  Chief Complaint:  Chief Complaint  Patient presents with  . Gynecologic Exam    History of Present Illness:Patient is a 58 y.o. [id number] presents for annual exam. The patient has no complaints today.   LMP: No LMP recorded. Patient is postmenopausal. Denies postmenopausal bleeding  The patient is sexually active. She denies dyspareunia.  The patient does perform self breast exams.  There is notable family history of breast or ovarian cancer in her family.  The patient wears seatbelts: yes.   The patient has regular exercise: no, reports "I could walk more".    The patient denies current symptoms of depression.     Review of Systems: ROS  Past Medical History:  Past Medical History:  Diagnosis Date  . Arthritis    shoulders  . Breast cyst, left    solitary cyst benign  . Depression   . Diabetes mellitus, type 2 ([place])   . Family history of adverse reaction to anesthesia    Son - PONV  . GERD (gastroesophageal reflux disease)   . Hyperlipidemia   . Hypertension     Past Surgical History:  Past Surgical History:  Procedure Laterality Date  . BREAST BIOPSY  1978  . COLONOSCOPY WITH PROPOFOL N/A 10/22/2018   Procedure: COLONOSCOPY WITH PROPOFOL;  Surgeon: [name], MD;  Location: [place];  Service: Endoscopy;  Laterality: N/A;  Diabetic - oral meds  . ENDOMETRIAL BIOPSY    . HYSTEROSCOPY  03/04/2006  . POLYPECTOMY N/A 10/22/2018   Procedure: POLYPECTOMY;  Surgeon: [name], MD;  Location: [place];  Service: Endoscopy;  Laterality: N/A;    Gynecologic History:  No LMP recorded. Patient is postmenopausal. Last Pap: Results were: 2019, NIL  Last mammogram: 08/14/2017 Results were: BI-RAD I  Obstetric History: [id number]  Family History:  Family History  Problem Relation Age of Onset  . Hypertension Mother   . Allergic rhinitis Mother   . Cancer Father        multiple myeloma  and bladder cancer.  Marland Kitchen Heart attack Father   . Multiple myeloma Father   . Hypertension Sister   . Epilepsy Daughter   . Seizures Daughter   . Allergic rhinitis Son   . Breast cancer Paternal Grandmother 69  . Bone cancer Paternal Grandmother     Social History:  Social History   Socioeconomic History  . Marital status: Married    Spouse name: [name]  . Number of children: 2  . Years of education: 74  . Highest education level: Not on file  Occupational History  . Occupation: [employer]  Social Needs  . Financial resource strain: Not on file  . Food insecurity    Worry: Not on file    Inability: Not on file  . Transportation needs    Medical: Not on file    Non-medical: Not on file  Tobacco Use  . Smoking status: Never Smoker  . Smokeless tobacco: Never Used  Substance and Sexual Activity  . Alcohol use: Yes    Alcohol/week: 1.0 standard drinks    Types: 1 Glasses of wine per week    Comment: once a month  . Drug use: No  . Sexual activity: Yes    Birth control/protection: Post-menopausal  Lifestyle  . Physical activity    Days per week: 3 days    Minutes per session: 30 min  . Stress: Not at all  Relationships  .  Social connections    Talks on phone: More than three times a week    Gets together: More than three times a week    Attends religious service: More than 4 times per year    Active member of club or organization: Yes    Attends meetings of clubs or organizations: More than 4 times per year    Relationship status: Married  . Intimate partner violence    Fear of current or ex partner: No    Emotionally abused: No    Physically abused: No    Forced sexual activity: No  Other Topics Concern  . Not on file  Social History Narrative  . Not on file    Allergies:  Allergies  Allergen Reactions  . Ampicillin     Unsure what reaction was  . Augmentin [Amoxicillin-Pot Clavulanate] Diarrhea  . Adhesive [Tape] Rash    Band-aids    Medications:  Prior to Admission medications   Medication Sig Start Date End Date Taking? Authorizing Provider  aspirin 81 MG tablet Take by mouth.   Yes [provider]  atorvastatin (LIPITOR) 40 MG tablet TAKE 1 TABLET DAILY BY  MOUTH. 04/06/18  Yes Jerrol Banana., MD  Coenzyme Q10 (COQ10) 200 MG CAPS Take by mouth. 04/02/11  Yes [provider]  Cyanocobalamin (VITAMIN B-12 PO) Take by mouth.   Yes [provider]  esomeprazole (NEXIUM) 20 MG capsule Take 20 mg by mouth daily at 12 noon.   Yes [provider]  hydrochlorothiazide (HYDRODIURIL) 25 MG tablet TAKE 1 TABLET DAILY 04/06/18  Yes Jerrol Banana., MD  meloxicam (MOBIC) 7.5 MG tablet Take 1 tablet (7.5 mg total) by mouth daily as needed. 06/01/17  Yes Jerrol Banana., MD  metFORMIN (GLUCOPHAGE) 1000 MG tablet Take 1 tablet (1,000 mg total) by mouth 2 (two) times daily with a meal. 12/15/17  Yes Jerrol Banana., MD  sertraline (ZOLOFT) 100 MG tablet Take 1 tablet (100 mg total) by mouth daily. 12/15/17  Yes Jerrol Banana., MD    Physical Exam Vitals: Blood pressure 112/62, pulse 95, height '5\' 2"'  (1.575 m), weight 147 lb (66.7 kg).  General: NAD HEENT: normocephalic, anicteric Thyroid: no enlargement, no palpable nodules Pulmonary: No increased work of breathing, CTAB Cardiovascular: RRR, distal pulses 2+ Breast: Breast symmetrical, no tenderness, no palpable nodules or masses, no skin or nipple retraction present, no nipple discharge.  No axillary or supraclavicular lymphadenopathy. Abdomen: NABS, soft, non-tender, non-distended.  Umbilicus without lesions.  No hepatomegaly, splenomegaly or masses palpable. No evidence of hernia  Genitourinary:  External: Normal external female genitalia.  Normal urethral meatus, normal Bartholin's and Skene's glands.    Vagina: Normal vaginal mucosa, no evidence of prolapse.    Cervix: Grossly normal in appearance, no bleeding  Uterus:  Non-enlarged, mobile, normal contour.  No CMT  Adnexa: ovaries non-enlarged, no adnexal masses  Rectal: deferred  Lymphatic: no evidence of inguinal lymphadenopathy Extremities: no edema, erythema, or tenderness Neurologic: Grossly intact Psychiatric: mood appropriate, affect full  Female chaperone present for pelvic and breast  portions of the physical exam     Assessment: 58 y.o. [id number] routine annual exam  Plan: Problem List Items Addressed This Visit    None    Visit Diagnoses    Breast cancer screening by mammogram    -  Primary   Relevant Orders   MM DIGITAL SCREENING BILATERAL      1) Mammogram - recommend  yearly screening mammogram.  Mammogram Was ordered today  2) STI screening  wasoffered and declined  3) ASCCP guidelines and rational discussed.  Patient opts for every 3 years screening interval  4) Osteoporosis  - per USPTF routine screening DEXA at age 72 - FRAX 39 year major fracture risk 14%,  10 year hip fracture risk 0.8%  Consider FDA-approved medical therapies in postmenopausal women and men aged 10 years and older, based on the following: a) A hip or vertebral (clinical or morphometric) fracture b) T-score ? -2.5 at the femoral neck or spine after appropriate evaluation to exclude secondary causes C) Low bone mass (T-score between -1.0 and -2.5 at the femoral neck or spine) and a 10-year probability of a hip fracture ? 3% or a 10-year probability of a major osteoporosis-related fracture ? 20% based on the [place]-adapted WHO algorithm   5) Routine healthcare maintenance including cholesterol, diabetes screening discussed managed by PCP  6) Colonoscopy is up to date.  Screening recommended starting at age 19 for average risk individuals, age 40 for individuals deemed at increased risk (including African Americans) and recommended to continue until age 51.  For patient age 11-85 individualized approach is recommended.  Gold standard screening is via  colonoscopy, Cologuard screening is an acceptable alternative for patient unwilling or unable to undergo colonoscopy.  "Colorectal cancer screening for average?risk adults: 2018 guideline update from the American Cancer Society"[place]: A Cancer Journal for Clinicians: Oct 22, 2016   7) Return in about 1 year (around 11/24/2019) for Annual.  Adrian Prows MD [place] 11/24/2018 9:05 AM

## 2018-11-24 NOTE — Patient Instructions (Signed)
Institute of Medicine Recommended Dietary Allowances for Calcium and Vitamin D  Age (yr) Calcium Recommended Dietary Allowance (mg/day) Vitamin D Recommended Dietary Allowance (international units/day)  9-18 1,300 600  19-50 1,000 600  51-70 1,200 600  71 and older 1,200 800  Data from Institute of Medicine. Dietary reference intakes: calcium, vitamin D. [place], [place]: National Academies Press; 2011.    

## 2019-01-14 ENCOUNTER — Encounter: Payer: Self-pay | Admitting: Physician Assistant

## 2019-01-14 ENCOUNTER — Ambulatory Visit
Admission: RE | Admit: 2019-01-14 | Discharge: 2019-01-14 | Disposition: A | Discharge status: Home / Self Care | Payer: 59 | Attending: Physician Assistant | Admitting: Physician Assistant

## 2019-01-14 ENCOUNTER — Ambulatory Visit: Payer: 59 | Admitting: Physician Assistant

## 2019-01-14 ENCOUNTER — Ambulatory Visit
Admission: RE | Admit: 2019-01-14 | Discharge: 2019-01-14 | Disposition: A | Discharge status: Home / Self Care | Payer: 59 | Source: Ambulatory Visit | Attending: Physician Assistant | Admitting: Physician Assistant

## 2019-01-14 ENCOUNTER — Other Ambulatory Visit: Payer: Self-pay

## 2019-01-14 VITALS — BP 119/81 | HR 92 | Temp 96.2°F | Resp 16 | Ht 62.0 in | Wt 146.0 lb

## 2019-01-14 DIAGNOSIS — N3000 Acute cystitis without hematuria: Secondary | ICD-10-CM | POA: Diagnosis not present

## 2019-01-14 DIAGNOSIS — R109 Unspecified abdominal pain: Secondary | ICD-10-CM

## 2019-01-14 DIAGNOSIS — R0781 Pleurodynia: Secondary | ICD-10-CM

## 2019-01-14 LAB — POCT URINALYSIS DIPSTICK
Appearance: ABNORMAL
Bilirubin, UA: NEGATIVE
Blood, UA: NEGATIVE
Glucose, UA: NEGATIVE
Nitrite, UA: NEGATIVE
Odor: NORMAL
Protein, UA: NEGATIVE
Spec Grav, UA: 1.015 (ref 1.010–1.025)
Urobilinogen, UA: 0.2 E.U./dL
pH, UA: 5 (ref 5.0–8.0)

## 2019-01-14 NOTE — Progress Notes (Signed)
Patient: Andrea Soto Female    DOB: 1961-02-25   58 y.o.   MRN: [id number] Visit Date: 01/14/2019  Today's Provider: Mar Daring, PA-C   Chief Complaint  Patient presents with  . Flank Pain   Subjective:     Flank Pain This is a new problem. The current episode started more than 1 month ago. The problem occurs intermittently. The problem has been gradually worsening since onset. The quality of the pain is described as aching. The pain does not radiate. The pain is at a severity of 7/10. The pain is moderate. The symptoms are aggravated by bending, position, standing and twisting. Pertinent negatives include no abdominal pain, bladder incontinence, bowel incontinence, chest pain, dysuria, fever, headaches, leg pain, numbness, paresis, paresthesias, pelvic pain, perianal numbness, tingling, weakness or weight loss. She has tried NSAIDs (Aleve) for the symptoms. The treatment provided mild relief.   Patient has been having intermittent right flank pain for over 1 month. Patient states pain has started to gradually worsen and last longer when it occurs. Patient states pain does radiate. Patient states pain can can be stabbing when she twists, bends or lifts. Patient takes Aleve for treatment with mild relief.   Allergies  Allergen Reactions  . Ampicillin     Unsure what reaction was  . Augmentin [Amoxicillin-Pot Clavulanate] Diarrhea  . Adhesive [Tape] Rash    Band-aids     Current Outpatient Medications:  .  aspirin 81 MG tablet, Take by mouth., Disp: , Rfl:  .  atorvastatin (LIPITOR) 40 MG tablet, TAKE 1 TABLET DAILY BY  MOUTH., Disp: 90 tablet, Rfl: 3 .  Coenzyme Q10 (COQ10) 200 MG CAPS, Take by mouth., Disp: , Rfl:  .  Cyanocobalamin (VITAMIN B-12 PO), Take by mouth., Disp: , Rfl:  .  esomeprazole (NEXIUM) 20 MG capsule, Take 20 mg by mouth daily at 12 noon., Disp: , Rfl:  .  hydrochlorothiazide (HYDRODIURIL) 25 MG tablet, TAKE 1 TABLET DAILY, Disp: 90 tablet, Rfl:  3 .  meloxicam (MOBIC) 7.5 MG tablet, Take 1 tablet (7.5 mg total) by mouth daily as needed., Disp: 90 tablet, Rfl: 3 .  metFORMIN (GLUCOPHAGE) 1000 MG tablet, TAKE 1 TABLET BY MOUTH TWO  TIMES DAILY WITH A MEAL, Disp: 180 tablet, Rfl: 3 .  sertraline (ZOLOFT) 100 MG tablet, TAKE 1 TABLET BY MOUTH  DAILY, Disp: 90 tablet, Rfl: 3  Review of Systems  Constitutional: Negative for appetite change, chills, fatigue, fever and weight loss.  Respiratory: Negative for chest tightness and shortness of breath.   Cardiovascular: Negative for chest pain and palpitations.  Gastrointestinal: Negative for abdominal pain, bowel incontinence, nausea and vomiting.  Genitourinary: Positive for flank pain. Negative for bladder incontinence, dysuria and pelvic pain.  Neurological: Negative for dizziness, tingling, weakness, numbness, headaches and paresthesias.    Social History   Tobacco Use  . Smoking status: Never Smoker  . Smokeless tobacco: Never Used  Substance Use Topics  . Alcohol use: Yes    Alcohol/week: 1.0 standard drinks    Types: 1 Glasses of wine per week    Comment: once a month      Objective:   BP 119/81 (BP Location: Left Arm, Patient Position: Sitting, Cuff Size: Large)   Pulse 92   Temp (!) 96.2 F (35.7 C) (Temporal)   Resp 16   Ht 5\' 2"  (1.575 m)   Wt 146 lb (66.2 kg)   SpO2 96%   BMI 26.70 kg/m  Vitals:   01/14/19 0952  BP: 119/81  Pulse: 92  Resp: 16  Temp: (!) 96.2 F (35.7 C)  TempSrc: Temporal  SpO2: 96%  Weight: 146 lb (66.2 kg)  Height: 5\' 2"  (1.575 m)     Physical Exam Vitals signs reviewed.  Constitutional:      General: She is not in acute distress.    Appearance: Normal appearance. She is well-developed. She is not ill-appearing or diaphoretic.  Neck:     Musculoskeletal: Normal range of motion and neck supple.     Thyroid: No thyromegaly.     Vascular: No JVD.     Trachea: No tracheal deviation.  Cardiovascular:     Rate and Rhythm: Normal  rate and regular rhythm.     Pulses: Normal pulses.     Heart sounds: Normal heart sounds. No murmur. No friction rub. No gallop.   Pulmonary:     Effort: Pulmonary effort is normal. No respiratory distress.     Breath sounds: Normal breath sounds. No wheezing or rales.  Abdominal:     General: Abdomen is flat. Bowel sounds are normal.     Palpations: Abdomen is soft. There is no mass.     Tenderness: There is no abdominal tenderness. There is no right CVA tenderness, left CVA tenderness or guarding.     Hernia: No hernia is present.  Lymphadenopathy:     Cervical: No cervical adenopathy.  Neurological:     Mental Status: She is alert.      No results found for any visits on 01/14/19.     Assessment & Plan    1. Right flank pain UA positive. Will send for culture as below. Will also check labs as below and f/u pending results. Macrobid will be sent in for empiric UTI treatment. I will f/u pending C&S results. Call if worsening.  - POCT urinalysis dipstick - CULTURE, URINE COMPREHENSIVE - CBC w/Diff/Platelet - Comprehensive Metabolic Panel (CMET) - Lipase  2. Rib pain on right side Xrays obtained to r/o bony involvement for right sided flank pain. Will f/u pending results.  - DG Ribs Unilateral Right; Future - DG Thoracic Spine W/Swimmers; Future  3. Acute cystitis without hematuria See above medical treatment plan for #1.  - nitrofurantoin, macrocrystal-monohydrate, (MACROBID) 100 MG capsule; Take 1 capsule (100 mg total) by mouth 2 (two) times daily.  Dispense: 20 capsule; Refill: 0     Mar Daring, PA-C  [facility]

## 2019-01-14 NOTE — Patient Instructions (Signed)
Costochondritis  Costochondritis is swelling and irritation (inflammation) of the tissue (cartilage) that connects your ribs to your breastbone (sternum). This causes pain in the front of your chest. The pain usually starts gradually and involves more than one rib. What are the causes? The exact cause of this condition is not always known. It results from stress on the cartilage where your ribs attach to your sternum. The cause of this stress could be:  Chest injury (trauma).  Exercise or activity, such as lifting.  Severe coughing. What increases the risk? You may be at higher risk for this condition if you:  Are female.  Are 30?60 years old.  Recently started a new exercise or work activity.  Have low levels of vitamin D.  Have a condition that makes you cough frequently. What are the signs or symptoms? The main symptom of this condition is chest pain. The pain:  Usually starts gradually and can be sharp or dull.  Gets worse with deep breathing, coughing, or exercise.  Gets better with rest.  May be worse when you press on the sternum-rib connection (tenderness). How is this diagnosed? This condition is diagnosed based on your symptoms, medical history, and a physical exam. Your health care provider will check for tenderness when pressing on your sternum. This is the most important finding. You may also have tests to rule out other causes of chest pain. These may include:  A chest X-ray to check for lung problems.  An electrocardiogram (ECG) to see if you have a heart problem that could be causing the pain.  An imaging scan to rule out a chest or rib fracture. How is this treated? This condition usually goes away on its own over time. Your health care provider may prescribe an NSAID to reduce pain and inflammation. Your health care provider may also suggest that you:  Rest and avoid activities that make pain worse.  Apply heat or cold to the area to reduce pain and  inflammation.  Do exercises to stretch your chest muscles. If these treatments do not help, your health care provider may inject a numbing medicine at the sternum-rib connection to help relieve the pain. Follow these instructions at home:  Avoid activities that make pain worse. This includes any activities that use chest, abdominal, and side muscles.  If directed, put ice on the painful area: ? Put ice in a plastic bag. ? Place a towel between your skin and the bag. ? Leave the ice on for 20 minutes, 2-3 times a day.  If directed, apply heat to the affected area as often as told by your health care provider. Use the heat source that your health care provider recommends, such as a moist heat pack or a heating pad. ? Place a towel between your skin and the heat source. ? Leave the heat on for 20-30 minutes. ? Remove the heat if your skin turns bright red. This is especially important if you are unable to feel pain, heat, or cold. You may have a greater risk of getting burned.  Take over-the-counter and prescription medicines only as told by your health care provider.  Return to your normal activities as told by your health care provider. Ask your health care provider what activities are safe for you.  Keep all follow-up visits as told by your health care provider. This is important. Contact a health care provider if:  You have chills or a fever.  Your pain does not go away or it gets   worse.  You have a cough that does not go away (is persistent). Get help right away if:  You have shortness of breath. This information is not intended to replace advice given to you by your health care provider. Make sure you discuss any questions you have with your health care provider. Document Released: 02/19/2005 Document Revised: 05/27/2017 Document Reviewed: 09/05/2015 Elsevier Patient Education  2020 [employer].

## 2019-01-15 LAB — CBC WITH DIFFERENTIAL/PLATELET
Basophils Absolute: 0.1 10*3/uL (ref 0.0–0.2)
Basos: 1 %
EOS (ABSOLUTE): 0.3 10*3/uL (ref 0.0–0.4)
Eos: 3 %
Hematocrit: 38.7 % (ref 34.0–46.6)
Hemoglobin: 13.3 g/dL (ref 11.1–15.9)
Immature Grans (Abs): 0 10*3/uL (ref 0.0–0.1)
Immature Granulocytes: 0 %
Lymphocytes Absolute: 2.3 10*3/uL (ref 0.7–3.1)
Lymphs: 24 %
MCH: 27.8 pg (ref 26.6–33.0)
MCHC: 34.4 g/dL (ref 31.5–35.7)
MCV: 81 fL (ref 79–97)
Monocytes Absolute: 0.7 10*3/uL (ref 0.1–0.9)
Monocytes: 7 %
Neutrophils Absolute: 6.1 10*3/uL (ref 1.4–7.0)
Neutrophils: 65 %
Platelets: 310 10*3/uL (ref 150–450)
RBC: 4.79 x10E6/uL (ref 3.77–5.28)
RDW: 12.4 % (ref 11.7–15.4)
WBC: 9.4 10*3/uL (ref 3.4–10.8)

## 2019-01-15 LAB — COMPREHENSIVE METABOLIC PANEL
ALT: 20 IU/L (ref 0–32)
AST: 18 IU/L (ref 0–40)
Albumin/Globulin Ratio: 2 (ref 1.2–2.2)
Albumin: 4.8 g/dL (ref 3.8–4.9)
Alkaline Phosphatase: 84 IU/L (ref 39–117)
BUN/Creatinine Ratio: 23 (ref 12–28)
BUN: 18 mg/dL (ref 8–27)
Bilirubin Total: <0.2 mg/dL (ref 0.0–1.2)
CO2: 23 mmol/L (ref 20–29)
Calcium: 9.7 mg/dL (ref 8.7–10.3)
Chloride: 96 mmol/L (ref 96–106)
Creatinine, Ser: 0.78 mg/dL (ref 0.57–1.00)
GFR calc Af Amer: 96 mL/min/{1.73_m2} (ref >59)
GFR calc non Af Amer: 83 mL/min/{1.73_m2} (ref >59)
Globulin, Total: 2.4 g/dL (ref 1.5–4.5)
Glucose: 173 mg/dL — ABNORMAL HIGH (ref 65–99)
Potassium: 3.6 mmol/L (ref 3.5–5.2)
Sodium: 139 mmol/L (ref 134–144)
Total Protein: 7.2 g/dL (ref 6.0–8.5)

## 2019-01-15 LAB — LIPASE: Lipase: 47 U/L (ref 14–72)

## 2019-01-17 ENCOUNTER — Telehealth: Payer: Self-pay

## 2019-01-17 LAB — CULTURE, URINE COMPREHENSIVE

## 2019-01-17 MED ORDER — NITROFURANTOIN MONOHYD MACRO 100 MG PO CAPS: 100.0000 mg | ORAL_CAPSULE | Freq: Two times a day (BID) | ORAL | 0 refills | Status: DC

## 2019-01-17 NOTE — Telephone Encounter (Signed)
-----   Message from Mar Daring, [place] sent at 01/17/2019 11:20 AM EDT ----- Xray of ribs and thoracic spine are normal and unremarkable. No fractures or abnormalities noted.

## 2019-01-17 NOTE — Telephone Encounter (Signed)
Viewed by Juanda Bond on 01/17/2019 12:11 PM Written by Mar Daring, PA-C on 01/17/2019 11:20 AM Xray of ribs and thoracic spine are normal and unremarkable. No fractures or abnormalities noted

## 2019-02-02 ENCOUNTER — Telehealth: Payer: Self-pay | Admitting: Family Medicine

## 2019-02-02 NOTE — Telephone Encounter (Signed)
Patient states she needed an appointment today. Explained to her that there was no availability until tomorrow. She states that she will call back for an appointment.

## 2019-02-02 NOTE — Telephone Encounter (Signed)
Needs OV.  

## 2019-02-02 NOTE — Telephone Encounter (Signed)
Pt called saying she was in about two weeks ago and seen Andrea Soto.  She was given an antibiotic and treated for a UTI.  She was having pain in her rib area on the right side.  She is still having that same pain but not as bad.  Please advise  [phone]  Con Memos

## 2019-02-02 NOTE — Telephone Encounter (Signed)
yes

## 2019-02-03 ENCOUNTER — Other Ambulatory Visit: Payer: Self-pay

## 2019-02-03 ENCOUNTER — Encounter: Payer: Self-pay | Admitting: Family Medicine

## 2019-02-03 ENCOUNTER — Ambulatory Visit: Payer: 59 | Admitting: Family Medicine

## 2019-02-03 VITALS — BP 120/70 | HR 68 | Temp 98.6°F | Resp 16 | Ht 62.0 in

## 2019-02-03 DIAGNOSIS — K219 Gastro-esophageal reflux disease without esophagitis: Secondary | ICD-10-CM

## 2019-02-03 DIAGNOSIS — R109 Unspecified abdominal pain: Secondary | ICD-10-CM | POA: Diagnosis not present

## 2019-02-03 DIAGNOSIS — R1084 Generalized abdominal pain: Secondary | ICD-10-CM | POA: Diagnosis not present

## 2019-02-03 DIAGNOSIS — E119 Type 2 diabetes mellitus without complications: Secondary | ICD-10-CM

## 2019-02-03 NOTE — Progress Notes (Signed)
Patient: Andrea Soto Female    DOB: November 18, 1960   58 y.o.   MRN: [id number] Visit Date: 02/03/2019  Today's Provider: [name], MD   Chief Complaint  Patient presents with  . Abdominal Pain   Subjective:   HPI Patient comes in today c/o abdominal pain X 2 weeks. She reports that it is mainly on her right side. She was seen in the office on 01/14/2019 by Grace Bushy, PA and was treated for a UTI with nitrofurantion X 10 days. She reports that she is still having pain and tenderness under her right ribcage. Not related to eating per pt.  BP Readings from Last 3 Encounters:  02/03/19 120/70  01/14/19 119/81  11/24/18 112/62   Wt Readings from Last 3 Encounters:  01/14/19 146 lb (66.2 kg)  11/24/18 147 lb (66.7 kg)  10/22/18 139 lb (63 kg)    Allergies  Allergen Reactions  . Ampicillin     Unsure what reaction was  . Augmentin [Amoxicillin-Pot Clavulanate] Diarrhea  . Adhesive [Tape] Rash    Band-aids     Current Outpatient Medications:  .  aspirin 81 MG tablet, Take by mouth., Disp: , Rfl:  .  atorvastatin (LIPITOR) 40 MG tablet, TAKE 1 TABLET DAILY BY  MOUTH., Disp: 90 tablet, Rfl: 3 .  Coenzyme Q10 (COQ10) 200 MG CAPS, Take by mouth., Disp: , Rfl:  .  Cyanocobalamin (VITAMIN B-12 PO), Take by mouth., Disp: , Rfl:  .  esomeprazole (NEXIUM) 20 MG capsule, Take 20 mg by mouth daily at 12 noon., Disp: , Rfl:  .  hydrochlorothiazide (HYDRODIURIL) 25 MG tablet, TAKE 1 TABLET DAILY, Disp: 90 tablet, Rfl: 3 .  meloxicam (MOBIC) 7.5 MG tablet, Take 1 tablet (7.5 mg total) by mouth daily as needed., Disp: 90 tablet, Rfl: 3 .  metFORMIN (GLUCOPHAGE) 1000 MG tablet, TAKE 1 TABLET BY MOUTH TWO  TIMES DAILY WITH A MEAL, Disp: 180 tablet, Rfl: 3 .  sertraline (ZOLOFT) 100 MG tablet, TAKE 1 TABLET BY MOUTH  DAILY, Disp: 90 tablet, Rfl: 3 .  nitrofurantoin, macrocrystal-monohydrate, (MACROBID) 100 MG capsule, Take 1 capsule (100 mg total) by mouth 2 (two) times daily.  (Patient not taking: Reported on 02/03/2019), Disp: 20 capsule, Rfl: 0  Review of Systems  Constitutional: Negative for activity change and fatigue.  Respiratory: Negative.   Cardiovascular: Negative.   Gastrointestinal: Negative.   Endocrine: Negative.   Genitourinary: Positive for flank pain. Negative for decreased urine volume, dysuria, frequency, hematuria, pelvic pain, urgency, vaginal bleeding, vaginal discharge and vaginal pain.  Musculoskeletal: Positive for myalgias.  Allergic/Immunologic: Negative.   Neurological: Negative.   Psychiatric/Behavioral: Negative.     Social History   Tobacco Use  . Smoking status: Never Smoker  . Smokeless tobacco: Never Used  Substance Use Topics  . Alcohol use: Yes    Alcohol/week: 1.0 standard drinks    Types: 1 Glasses of wine per week    Comment: once a month      Objective:   BP 120/70   Pulse 68   Temp 98.6 F (37 C)   Resp 16   Ht 5\' 2"  (1.575 m)   SpO2 98%   BMI 26.70 kg/m  Vitals:   02/03/19 1114  BP: 120/70  Pulse: 68  Resp: 16  Temp: 98.6 F (37 C)  SpO2: 98%  Height: 5\' 2"  (1.575 m)  Body mass index is 26.7 kg/m.   Physical Exam Vitals signs reviewed.  Constitutional:  Appearance: She is well-developed.  HENT:     Head: Normocephalic and atraumatic.     Right Ear: External ear normal.     Left Ear: External ear normal.     Nose: Nose normal.  Eyes:     General: No scleral icterus.    Conjunctiva/sclera: Conjunctivae normal.  Neck:     Thyroid: No thyromegaly.  Cardiovascular:     Rate and Rhythm: Normal rate and regular rhythm.     Heart sounds: Normal heart sounds.  Pulmonary:     Effort: Pulmonary effort is normal.     Breath sounds: Normal breath sounds.  Chest:     Breasts:        Right: Normal.        Left: Normal.  Abdominal:     Palpations: Abdomen is soft.     Tenderness: There is abdominal tenderness in the right upper quadrant.     Comments: Very mild RUQ abd tenderness.   Lymphadenopathy:     Cervical: No cervical adenopathy.  Skin:    General: Skin is warm and dry.  Neurological:     Mental Status: She is alert and oriented to person, place, and time.  Psychiatric:        Behavior: Behavior normal.        Thought Content: Thought content normal.        Judgment: Judgment normal.      No results found for any visits on 02/03/19.     Assessment & Plan    1. Right flank pain Pain for 3 months--it is better per pt. - Urine Culture  2. Generalized abdominal pain  - US Abdomen Complete; Future  3. Gastroesophageal reflux disease without esophagitis   4. Type 2 diabetes mellitus without complication, unspecified whether long term insulin use ([place])      [name], MD  [facility]

## 2019-02-05 LAB — URINE CULTURE

## 2019-02-07 ENCOUNTER — Telehealth: Payer: Self-pay

## 2019-02-07 NOTE — Telephone Encounter (Signed)
-----   Message from Jerrol Banana., MD sent at 02/07/2019  8:09 AM EDT ----- Urine OK

## 2019-02-07 NOTE — Telephone Encounter (Signed)
Patient notified of results.

## 2019-02-08 ENCOUNTER — Other Ambulatory Visit: Payer: Self-pay

## 2019-02-08 ENCOUNTER — Ambulatory Visit
Admission: RE | Admit: 2019-02-08 | Discharge: 2019-02-08 | Disposition: A | Discharge status: Home / Self Care | Payer: 59 | Source: Ambulatory Visit | Attending: Family Medicine | Admitting: Family Medicine

## 2019-02-08 DIAGNOSIS — R1084 Generalized abdominal pain: Secondary | ICD-10-CM | POA: Insufficient documentation

## 2019-02-17 ENCOUNTER — Other Ambulatory Visit: Payer: Self-pay | Admitting: Family Medicine

## 2019-03-08 ENCOUNTER — Other Ambulatory Visit: Payer: Self-pay

## 2019-03-08 ENCOUNTER — Encounter: Payer: Self-pay | Admitting: Family Medicine

## 2019-03-08 ENCOUNTER — Ambulatory Visit: Payer: 59 | Admitting: Family Medicine

## 2019-03-08 VITALS — BP 122/68 | HR 92 | Temp 97.1°F | Resp 18 | Wt 145.4 lb

## 2019-03-08 DIAGNOSIS — E7800 Pure hypercholesterolemia, unspecified: Secondary | ICD-10-CM | POA: Diagnosis not present

## 2019-03-08 DIAGNOSIS — Z23 Encounter for immunization: Secondary | ICD-10-CM | POA: Diagnosis not present

## 2019-03-08 DIAGNOSIS — I10 Essential (primary) hypertension: Secondary | ICD-10-CM | POA: Diagnosis not present

## 2019-03-08 DIAGNOSIS — R1084 Generalized abdominal pain: Secondary | ICD-10-CM | POA: Diagnosis not present

## 2019-03-08 DIAGNOSIS — E119 Type 2 diabetes mellitus without complications: Secondary | ICD-10-CM | POA: Diagnosis not present

## 2019-03-08 LAB — POCT GLYCOSYLATED HEMOGLOBIN (HGB A1C): Hemoglobin A1C: 6.5 % — AB (ref 4.0–5.6)

## 2019-03-08 NOTE — Progress Notes (Signed)
Patient: Harlee Abernathey Female    DOB: [birth year]   58 y.o.   MRN: [id number] Visit Date: 03/08/2019  Today's Provider: [name], MD   Chief Complaint  Patient presents with  . Abdominal Pain   Subjective:     HPI 1 month follow up for abdominal pain.  This has completely resolved.  Ultrasound revealed fatty liver.  Overall she is feeling well.  It is time for follow-up on her diabetes. Allergies  Allergen Reactions  . Ampicillin     Unsure what reaction was  . Augmentin [Amoxicillin-Pot Clavulanate] Diarrhea  . Adhesive [Tape] Rash    Band-aids     Current Outpatient Medications:  .  aspirin 81 MG tablet, Take by mouth., Disp: , Rfl:  .  atorvastatin (LIPITOR) 40 MG tablet, TAKE 1 TABLET BY MOUTH  DAILY, Disp: 90 tablet, Rfl: 3 .  Coenzyme Q10 (COQ10) 200 MG CAPS, Take by mouth., Disp: , Rfl:  .  Cyanocobalamin (VITAMIN B-12 PO), Take by mouth., Disp: , Rfl:  .  esomeprazole (NEXIUM) 20 MG capsule, Take 20 mg by mouth daily at 12 noon., Disp: , Rfl:  .  hydrochlorothiazide (HYDRODIURIL) 25 MG tablet, TAKE 1 TABLET BY MOUTH  DAILY, Disp: 90 tablet, Rfl: 3 .  meloxicam (MOBIC) 7.5 MG tablet, Take 1 tablet (7.5 mg total) by mouth daily as needed., Disp: 90 tablet, Rfl: 3 .  metFORMIN (GLUCOPHAGE) 1000 MG tablet, TAKE 1 TABLET BY MOUTH TWO  TIMES DAILY WITH A MEAL, Disp: 180 tablet, Rfl: 3 .  nitrofurantoin, macrocrystal-monohydrate, (MACROBID) 100 MG capsule, Take 1 capsule (100 mg total) by mouth 2 (two) times daily. (Patient not taking: Reported on 02/03/2019), Disp: 20 capsule, Rfl: 0 .  sertraline (ZOLOFT) 100 MG tablet, TAKE 1 TABLET BY MOUTH  DAILY, Disp: 90 tablet, Rfl: 3  Review of Systems  Constitutional: Negative.   Respiratory: Negative.   Cardiovascular: Negative.   Gastrointestinal: Negative.   Endocrine: Negative.   Genitourinary: Negative.   Allergic/Immunologic: Negative.   Neurological: Negative.   Psychiatric/Behavioral: Negative.      Social History   Tobacco Use  . Smoking status: Never Smoker  . Smokeless tobacco: Never Used  Substance Use Topics  . Alcohol use: Yes    Alcohol/week: 1.0 standard drinks    Types: 1 Glasses of wine per week    Comment: once a month      Objective:   BP 122/68 (BP Location: Left Arm, Patient Position: Sitting, Cuff Size: Normal)   Pulse 92   Temp (!) 97.1 F (36.2 C) (Temporal)   Resp 18   Wt 145 lb 6.4 oz (66 kg)   SpO2 99%   BMI 26.59 kg/m  Vitals:   03/08/19 1559  BP: 122/68  Pulse: 92  Resp: 18  Temp: (!) 97.1 F (36.2 C)  TempSrc: Temporal  SpO2: 99%  Weight: 145 lb 6.4 oz (66 kg)  Body mass index is 26.59 kg/m.   Physical Exam Vitals signs reviewed.  Constitutional:      Appearance: She is well-developed.  HENT:     Head: Normocephalic and atraumatic.  Eyes:     General: No scleral icterus.    Conjunctiva/sclera: Conjunctivae normal.  Neck:     Thyroid: No thyromegaly.  Cardiovascular:     Rate and Rhythm: Normal rate and regular rhythm.     Heart sounds: Normal heart sounds.  Pulmonary:     Effort: Pulmonary effort is normal.  Breath sounds: Normal breath sounds.  Abdominal:     Palpations: Abdomen is soft.  Skin:    General: Skin is warm and dry.  Neurological:     Mental Status: She is alert and oriented to person, place, and time.  Psychiatric:        Behavior: Behavior normal.        Thought Content: Thought content normal.        Judgment: Judgment normal.      No results found for any visits on 03/08/19.     Assessment & Plan    1. Need for immunization against influenza  - Flu Vaccine QUAD 36+ mos IM  2. Type 2 diabetes mellitus without complication, unspecified whether long term insulin use (H[facility]) A1c today is 6.5.  Good control.  Return to clinic 4 months. - POCT HgB A1C  3. Generalized abdominal pain Resolved.  Fatty liver was found during ultrasound.  Discussed diet and exercise and lifestyle regimen.  4.  Essential hypertension Good control on hydro-Diuril.  Consider low-dose ARB.  5. Hypercholesteremia On atorvastatin and diabetic.     Richard Cranford Mon, MD  [facility]

## 2019-06-09 ENCOUNTER — Other Ambulatory Visit: Payer: Self-pay

## 2019-06-09 ENCOUNTER — Telehealth: Payer: Self-pay | Admitting: Family Medicine

## 2019-06-09 ENCOUNTER — Ambulatory Visit (INDEPENDENT_AMBULATORY_CARE_PROVIDER_SITE_OTHER): Payer: PRIVATE HEALTH INSURANCE | Admitting: Family Medicine

## 2019-06-09 ENCOUNTER — Encounter: Payer: Self-pay | Admitting: Family Medicine

## 2019-06-09 VITALS — BP 121/79 | HR 85 | Temp 96.9°F | Resp 16 | Ht 62.0 in | Wt 145.0 lb

## 2019-06-09 DIAGNOSIS — M25511 Pain in right shoulder: Secondary | ICD-10-CM | POA: Diagnosis not present

## 2019-06-09 MED ORDER — MELOXICAM 15 MG PO TABS: 15.0000 mg | ORAL_TABLET | Freq: Every day | ORAL | 0 refills | Status: DC

## 2019-06-09 NOTE — Telephone Encounter (Signed)
Pt stated she pulled a muscle in her right arm and would like to know if Dr. Rosanna Randy would send in a rx for a muscle relaxer.  [facility] [place], [place] AT [facility] OF [address] [facility] Phone:  [phone]  Fax:  [phone]

## 2019-06-09 NOTE — Patient Instructions (Signed)
Shoulder Pain Many things can cause shoulder pain, including:  An injury to the shoulder.  Overuse of the shoulder.  Arthritis. The source of the pain can be:  Inflammation.  An injury to the shoulder joint.  An injury to a tendon, ligament, or bone. Follow these instructions at home: Pay attention to changes in your symptoms. Let your health care provider know about them. Follow these instructions to relieve your pain. If you have a sling:  Wear the sling as told by your health care provider. Remove it only as told by your health care provider.  Loosen the sling if your fingers tingle, become numb, or turn cold and blue.  Keep the sling clean.  If the sling is not waterproof: ? Do not let it get wet. Remove it to shower or bathe.  Move your arm as little as possible, but keep your hand moving to prevent swelling. Managing pain, stiffness, and swelling   If directed, put ice on the painful area: ? Put ice in a plastic bag. ? Place a towel between your skin and the bag. ? Leave the ice on for 20 minutes, 2-3 times per day. Stop applying ice if it does not help with the pain.  Squeeze a soft ball or a foam pad as much as possible. This helps to keep the shoulder from swelling. It also helps to strengthen the arm. General instructions  Take over-the-counter and prescription medicines only as told by your health care provider.  Keep all follow-up visits as told by your health care provider. This is important. Contact a health care provider if:  Your pain gets worse.  Your pain is not relieved with medicines.  New pain develops in your arm, hand, or fingers. Get help right away if:  Your arm, hand, or fingers: ? Tingle. ? Become numb. ? Become swollen. ? Become painful. ? Turn white or blue. Summary  Shoulder pain can be caused by an injury, overuse, or arthritis.  Pay attention to changes in your symptoms. Let your health care provider know about  them.  This condition may be treated with a sling, ice, and pain medicines.  Contact your health care provider if the pain gets worse or new pain develops. Get help right away if your arm, hand, or fingers tingle or become numb, swollen, or painful.  Keep all follow-up visits as told by your health care provider. This is important. This information is not intended to replace advice given to you by your health care provider. Make sure you discuss any questions you have with your health care provider. Document Revised: 11/24/2017 Document Reviewed: 11/24/2017 Elsevier Patient Education  2020 [employer]  

## 2019-06-09 NOTE — Telephone Encounter (Signed)
Fyi.

## 2019-06-09 NOTE — Progress Notes (Signed)
Patient: Andrea Soto Female    DOB: 15-Nov-1960   59 y.o.   MRN: [id number] Visit Date: 06/09/2019  Today's Provider: Lavon Paganini, MD   Chief Complaint  Patient presents with  . Shoulder Pain   Subjective:    I [place] S. Dimas, CMA, am acting as scribe for Lavon Paganini, MD.  HPI  Patient here today C/O right shoulder pain radiating down to hands with movement. Patient reports pain started 3 days ago. Patient denies any falls or injuries. Patient reports some swelling on right hand. Patient reports taking OTC Advil/Aleve/Tylenol, and cold compressions.  Occurred after pulling on some heavy totes.  Difficulty sleeping due to pain.  Has h/o fallling in 2002 on ice and R shoulder was injured and she had bursitis.  Allergies  Allergen Reactions  . Ampicillin     Unsure what reaction was  . Augmentin [Amoxicillin-Pot Clavulanate] Diarrhea  . Adhesive [Tape] Rash    Band-aids     Current Outpatient Medications:  .  aspirin 81 MG tablet, Take by mouth., Disp: , Rfl:  .  atorvastatin (LIPITOR) 40 MG tablet, TAKE 1 TABLET BY MOUTH  DAILY, Disp: 90 tablet, Rfl: 3 .  Coenzyme Q10 (COQ10) 200 MG CAPS, Take by mouth., Disp: , Rfl:  .  Cyanocobalamin (VITAMIN B-12 PO), Take by mouth., Disp: , Rfl:  .  esomeprazole (NEXIUM) 20 MG capsule, Take 20 mg by mouth daily at 12 noon., Disp: , Rfl:  .  hydrochlorothiazide (HYDRODIURIL) 25 MG tablet, TAKE 1 TABLET BY MOUTH  DAILY, Disp: 90 tablet, Rfl: 3 .  metFORMIN (GLUCOPHAGE) 1000 MG tablet, TAKE 1 TABLET BY MOUTH TWO  TIMES DAILY WITH A MEAL, Disp: 180 tablet, Rfl: 3 .  sertraline (ZOLOFT) 100 MG tablet, TAKE 1 TABLET BY MOUTH  DAILY, Disp: 90 tablet, Rfl: 3  Review of Systems  Constitutional: Negative.  Negative for chills and fever.  Respiratory: Negative.  Negative for cough.   Cardiovascular: Negative.  Negative for chest pain.  Musculoskeletal: Positive for myalgias and neck pain.    Social History   Tobacco Use   . Smoking status: Never Smoker  . Smokeless tobacco: Never Used  Substance Use Topics  . Alcohol use: Yes    Alcohol/week: 1.0 standard drinks    Types: 1 Glasses of wine per week    Comment: once a month      Objective:   BP 121/79 (BP Location: Left Arm, Patient Position: Sitting, Cuff Size: Normal)   Pulse 85   Temp (!) 96.9 F (36.1 C)   Resp 16   Ht 5\' 2"  (1.575 m)   Wt 145 lb (65.8 kg)   BMI 26.52 kg/m  Vitals:   06/09/19 1533  BP: 121/79  Pulse: 85  Resp: 16  Temp: (!) 96.9 F (36.1 C)  Weight: 145 lb (65.8 kg)  Height: 5\' 2"  (1.575 m)  Body mass index is 26.52 kg/m.   Physical Exam Vitals reviewed.  Constitutional:      General: She is not in acute distress.    Appearance: Normal appearance. She is not diaphoretic.     Comments: Appears uncomfortable  HENT:     Head: Normocephalic and atraumatic.  Eyes:     General: No scleral icterus.    Conjunctiva/sclera: Conjunctivae normal.  Cardiovascular:     Rate and Rhythm: Normal rate and regular rhythm.     Pulses: Normal pulses.     Heart sounds: Normal heart sounds. No  murmur.  Pulmonary:     Effort: Pulmonary effort is normal. No respiratory distress.     Breath sounds: Normal breath sounds. No wheezing or rhonchi.  Musculoskeletal:     Right lower leg: No edema.     Left lower leg: No edema.     Comments: Right shoulder: Patient with limited abduction to only about 15 degrees.,  Even with passive range of motion.  Flexion is intact.  Internal and external rotation are both limited and cause severe pain.  Unable to perform strength testing due to patient's pain level.  No tenderness to palpation, except over her medial right shoulder blade  Skin:    General: Skin is warm and dry.     Capillary Refill: Capillary refill takes less than 2 seconds.     Findings: No bruising, erythema or rash.  Neurological:     Mental Status: She is alert and oriented to person, place, and time. Mental status is at  baseline.     Sensory: No sensory deficit.     Gait: Gait normal.  Psychiatric:        Mood and Affect: Mood normal.        Behavior: Behavior normal.      No results found for any visits on 06/09/19.     Assessment & Plan    1. Acute pain of right shoulder -New/acute problem -Patient with significant pain and loss of range of motion and unable to perform strength testing -Given the acute nature of the injury with the pulling motion, concern for possible rotator cuff tear -Offered x-rays, but patient knows that she will get these at the Ortho office, so we will hold off at this time -Advised sling for comfort at this time -Okay to continue NSAIDs and icing -Urgent referral to orthopedics for further evaluation and management - Ambulatory referral to Orthopedic Surgery   Return if symptoms worsen or fail to improve.   The entirety of the information documented in the History of Present Illness, Review of Systems and Physical Exam were personally obtained by me. Portions of this information were initially documented by Lynford Humphrey, CMA and reviewed by me for thoroughness and accuracy.    Deryck Hippler, Dionne Bucy, MD MPH [facility]

## 2019-06-09 NOTE — Telephone Encounter (Signed)
Noted  

## 2019-06-09 NOTE — Telephone Encounter (Signed)
Spoke with Andrea Soto on phone who stated that she has pain in her right arm when doing overhead activities and lifting, Andrea Soto stated that she believed she exerted her self and pulled muscle in her arm and was requesting that medication be called in. I advised Andrea Soto that a office visit would be more appropriate to determine if it is a pulled muscle and pain is not from something else. Andrea Soto agreed, I prescreened Andrea Soto and placed her on Dr. Brita Romp schedule this afternoon at 3:40PM since Sharyn Lull Flinchum had no openings to see Andrea Soto today. KW

## 2019-06-10 ENCOUNTER — Telehealth: Payer: Self-pay | Admitting: Family Medicine

## 2019-06-10 MED ORDER — MELOXICAM 15 MG PO TABS: 15.0000 mg | ORAL_TABLET | Freq: Every day | ORAL | 0 refills | Status: DC

## 2019-06-10 NOTE — Telephone Encounter (Signed)
OK to refill 90 day supply

## 2019-06-10 NOTE — Telephone Encounter (Signed)
Patient requesting refill of Meloxicam (Mobic), please advise.

## 2019-06-10 NOTE — Telephone Encounter (Signed)
Pt is requesting a refill for 90 day supply of:  meloxicam (MOBIC) 15 MG tablet  Thanks, [place]

## 2019-07-11 NOTE — Progress Notes (Signed)
Patient: Andrea Soto Female    DOB: 26-Jul-1960   59 y.o.   MRN: [id number] Visit Date: 07/12/2019  Today's Provider: [name], MD   Chief Complaint  Patient presents with  . Follow-up  . Diabetes  . Hypertension  . Hyperlipidemia   Subjective:     HPI  Pt now retired and doing well. Type 2 diabetes mellitus without complication, unspecified whether long term insulin use (HCC) From 03/08/2019-A1c 6.5. Good control. Return to clinic 4 months.  Not checking blood sugars at home.  Essential hypertension From 03/08/2019-Good control on hydro-Diuril.  Consider low-dose ARB.  Home blood pressure averaging 115/65.  Hypercholesteremia From 03/08/2019-On atorvastatin and diabetic.     Allergies  Allergen Reactions  . Ampicillin     Unsure what reaction was  . Augmentin [Amoxicillin-Pot Clavulanate] Diarrhea  . Adhesive [Tape] Rash    Band-aids     Current Outpatient Medications:  .  aspirin 81 MG tablet, Take by mouth., Disp: , Rfl:  .  atorvastatin (LIPITOR) 40 MG tablet, TAKE 1 TABLET BY MOUTH  DAILY, Disp: 90 tablet, Rfl: 3 .  Coenzyme Q10 (COQ10) 200 MG CAPS, Take by mouth., Disp: , Rfl:  .  Cyanocobalamin (VITAMIN B-12 PO), Take by mouth., Disp: , Rfl:  .  hydrochlorothiazide (HYDRODIURIL) 25 MG tablet, TAKE 1 TABLET BY MOUTH  DAILY, Disp: 90 tablet, Rfl: 3 .  meloxicam (MOBIC) 15 MG tablet, Take 1 tablet (15 mg total) by mouth daily., Disp: 90 tablet, Rfl: 0 .  metFORMIN (GLUCOPHAGE) 1000 MG tablet, TAKE 1 TABLET BY MOUTH TWO  TIMES DAILY WITH A MEAL, Disp: 180 tablet, Rfl: 3 .  sertraline (ZOLOFT) 100 MG tablet, TAKE 1 TABLET BY MOUTH  DAILY, Disp: 90 tablet, Rfl: 3 .  esomeprazole (NEXIUM) 20 MG capsule, Take 20 mg by mouth daily at 12 noon., Disp: , Rfl:   Review of Systems  Constitutional: Negative for appetite change, chills, fatigue and fever.  HENT: Negative.   Eyes: Negative.   Respiratory: Negative for chest tightness and  shortness of breath.   Cardiovascular: Negative for chest pain and palpitations.  Gastrointestinal: Negative for abdominal pain, nausea and vomiting.  Endocrine: Negative.   Musculoskeletal: Positive for arthralgias.  Allergic/Immunologic: Negative.   Neurological: Negative for dizziness and weakness.  Psychiatric/Behavioral: Negative.     Social History   Tobacco Use  . Smoking status: Never Smoker  . Smokeless tobacco: Never Used  Substance Use Topics  . Alcohol use: Yes    Alcohol/week: 1.0 standard drinks    Types: 1 Glasses of wine per week    Comment: once a month      Objective:   BP (!) 141/82 (BP Location: Left Arm, Patient Position: Sitting, Cuff Size: Large)   Pulse 82   Temp (!) 97.1 F (36.2 C) (Other (Comment))   Resp 16   Ht 5\' 2"  (1.575 m)   Wt 146 lb (66.2 kg)   SpO2 96%   BMI 26.70 kg/m  Vitals:   07/12/19 1519  BP: (!) 141/82  Pulse: 82  Resp: 16  Temp: (!) 97.1 F (36.2 C)  TempSrc: Other (Comment)  SpO2: 96%  Weight: 146 lb (66.2 kg)  Height: 5\' 2"  (1.575 m)  Body mass index is 26.7 kg/m.   Physical Exam Vitals reviewed.  Constitutional:      Appearance: She is well-developed.  HENT:     Head: Normocephalic and atraumatic.  Eyes:  General: No scleral icterus.    Conjunctiva/sclera: Conjunctivae normal.  Neck:     Thyroid: No thyromegaly.  Cardiovascular:     Rate and Rhythm: Normal rate and regular rhythm.     Heart sounds: Normal heart sounds.  Pulmonary:     Effort: Pulmonary effort is normal.     Breath sounds: Normal breath sounds.  Abdominal:     Palpations: Abdomen is soft.  Musculoskeletal:     Comments: Some pain with abduction of right shoulder.  Skin:    General: Skin is warm and dry.  Neurological:     Mental Status: She is alert and oriented to person, place, and time.  Psychiatric:        Mood and Affect: Mood normal.        Behavior: Behavior normal.        Thought Content: Thought content normal.         Judgment: Judgment normal.      No results found for any visits on 07/12/19.     Assessment & Plan    1. Type 2 diabetes mellitus without complication, unspecified whether long term insulin use (HCC) 6.9 today,good control.RTC 3-4 months/ - POCT glycosylated hemoglobin (Hb A1C)  2. Essential hypertension Add ARB due to DM, stop HCTZ. - losartan (COZAAR) 50 MG tablet; Take 1 tablet (50 mg total) by mouth daily.  Dispense: 90 tablet; Refill: 3  3. Hypercholesteremia  - atorvastatin (LIPITOR) 40 MG tablet; Take 1 tablet (40 mg total) by mouth daily.  Dispense: 90 tablet; Refill: 3  4. Type 2 diabetes mellitus without complication (HCC)  - metFORMIN (GLUCOPHAGE) 1000 MG tablet; TAKE 1 TABLET BY MOUTH TWO  TIMES DAILY WITH A MEAL  Dispense: 180 tablet; Refill: 3  5. Acute pain of right shoulder May need ortho referral. Advised to limit mobic use. - meloxicam (MOBIC) 15 MG tablet; Take 1 tablet (15 mg total) by mouth daily.  Dispense: 90 tablet; Refill: 0     Richard Cranford Mon, MD  [facility]

## 2019-07-12 ENCOUNTER — Encounter: Payer: Self-pay | Admitting: Family Medicine

## 2019-07-12 ENCOUNTER — Ambulatory Visit (INDEPENDENT_AMBULATORY_CARE_PROVIDER_SITE_OTHER): Payer: PRIVATE HEALTH INSURANCE | Admitting: Family Medicine

## 2019-07-12 ENCOUNTER — Other Ambulatory Visit: Payer: Self-pay

## 2019-07-12 VITALS — BP 141/82 | HR 82 | Temp 97.1°F | Resp 16 | Ht 62.0 in | Wt 146.0 lb

## 2019-07-12 DIAGNOSIS — E7800 Pure hypercholesterolemia, unspecified: Secondary | ICD-10-CM | POA: Diagnosis not present

## 2019-07-12 DIAGNOSIS — E119 Type 2 diabetes mellitus without complications: Secondary | ICD-10-CM

## 2019-07-12 DIAGNOSIS — I10 Essential (primary) hypertension: Secondary | ICD-10-CM | POA: Diagnosis not present

## 2019-07-12 DIAGNOSIS — M25511 Pain in right shoulder: Secondary | ICD-10-CM

## 2019-07-12 LAB — POCT GLYCOSYLATED HEMOGLOBIN (HGB A1C)
Est. average glucose Bld gHb Est-mCnc: 151
Hemoglobin A1C: 6.9 % — AB (ref 4.0–5.6)

## 2019-07-12 MED ORDER — MELOXICAM 15 MG PO TABS: 15.0000 mg | ORAL_TABLET | Freq: Every day | ORAL | 0 refills | Status: DC

## 2019-07-12 MED ORDER — METFORMIN HCL 1000 MG PO TABS: ORAL_TABLET | ORAL | 3 refills | Status: DC

## 2019-07-12 MED ORDER — ATORVASTATIN CALCIUM 40 MG PO TABS: 40.0000 mg | ORAL_TABLET | Freq: Every day | ORAL | 3 refills | Status: DC

## 2019-07-12 MED ORDER — LOSARTAN POTASSIUM 50 MG PO TABS: 50.0000 mg | ORAL_TABLET | Freq: Every day | ORAL | 3 refills | Status: DC

## 2019-07-12 NOTE — Patient Instructions (Addendum)
Stop hydrochlorothiazide. Start losartan 50 mg daily. Follow up 3-4 months.

## 2019-10-07 NOTE — Progress Notes (Signed)
Trena Platt Cummings,acting as a scribe for [name], MD.,have documented all relevant documentation on the behalf of [name], MD,as directed by  [name], MD while in the presence of [name], MD.  Established patient visit   Patient: Andrea Soto   DOB: 10-Sep-1960   59 y.o. Female  MRN: [id number] Visit Date: 10/11/2019  Today's healthcare provider: [name], MD   Chief Complaint  Patient presents with  . Diabetes Mellitus  . Hypertension  . Hyperlipidemia   Subjective    HPI  Patient now retired fully.  She feels well. Diabetes Mellitus Type II, follow-up  Lab Results  Component Value Date   HGBA1C 6.9 (A) 07/12/2019   HGBA1C 6.5 (A) 03/08/2019   HGBA1C 6.8 (H) 10/05/2018   Last seen for diabetes 3 months ago.  Management since then includes continuing the same treatment. She reports excellent compliance with treatment. She is not having side effects.   Home blood sugar records: not checking  Episodes of hypoglycemia? No    Current insulin regiment: none Most Recent Eye Exam: 09/2018  --------------------------------------------------------------------------------------------------- Hypertension, follow-up  BP Readings from Last 3 Encounters:  10/11/19 106/72  07/12/19 (!) 141/82  06/09/19 121/79   Wt Readings from Last 3 Encounters:  10/11/19 150 lb 6.4 oz (68.2 kg)  07/12/19 146 lb (66.2 kg)  06/09/19 145 lb (65.8 kg)     She was last seen for hypertension 3 months ago.  BP at that visit was 141/82. Management since that visit includes; Added ARB due to DM, stopped HCTZ. She reports excellent compliance with treatment. She is not having side effects.  She is exercising. She is adherent to low salt diet.   Outside blood pressures are checks once a week.  She does not smoke.  Use of agents associated with hypertension: none.    --------------------------------------------------------------------------------------------------- Lipid/Cholesterol, follow-up  Last Lipid Panel: Lab Results  Component Value Date   CHOL 152 10/05/2018   LDLCALC 76 10/05/2018   HDL 35 (L) 10/05/2018   TRIG 206 (H) 10/05/2018    She was last seen for this 1 years ago.  Management since that visit includes; labs checked showing-Stable--continue to work on lifestyle. She reports excellent compliance with treatment. She is not having side effects.  She is following a Low fat, Low Sodium diet. Current exercise: walking  Last metabolic panel Lab Results  Component Value Date   GLUCOSE 173 (H) 01/14/2019   NA 139 01/14/2019   K 3.6 01/14/2019   BUN 18 01/14/2019   CREATININE 0.78 01/14/2019   GFRNONAA 83 01/14/2019   GFRAA 96 01/14/2019   CALCIUM 9.7 01/14/2019   AST 18 01/14/2019   ALT 20 01/14/2019   The 10-year ASCVD risk score Mikey Bussing DC Jr., et al., 2013) is: 7%  ---------------------------------------------------------------------------------------------------  Acute pain of right shoulder From 07/12/2019-May need ortho referral. Advised to limit mobic use.  Social History   Tobacco Use  . Smoking status: Never Smoker  . Smokeless tobacco: Never Used  Substance Use Topics  . Alcohol use: Yes    Alcohol/week: 1.0 standard drinks    Types: 1 Glasses of wine per week    Comment: once a month  . Drug use: No       Medications: Outpatient Medications Prior to Visit  Medication Sig  . aspirin 81 MG tablet Take by mouth.  Marland Kitchen atorvastatin (LIPITOR) 40 MG tablet Take 1 tablet (40 mg total) by mouth daily.  Marland Kitchen  Coenzyme Q10 (COQ10) 200 MG CAPS Take by mouth.  . losartan (COZAAR) 50 MG tablet Take 1 tablet (50 mg total) by mouth daily.  . metFORMIN (GLUCOPHAGE) 1000 MG tablet TAKE 1 TABLET BY MOUTH TWO  TIMES DAILY WITH A MEAL  . sertraline (ZOLOFT) 100 MG tablet TAKE 1 TABLET BY MOUTH  DAILY  . Cyanocobalamin  (VITAMIN B-12 PO) Take by mouth.  . esomeprazole (NEXIUM) 20 MG capsule Take 20 mg by mouth daily at 12 noon.  . hydrochlorothiazide (HYDRODIURIL) 25 MG tablet TAKE 1 TABLET BY MOUTH  DAILY (Patient not taking: Reported on 10/11/2019)  . meloxicam (MOBIC) 15 MG tablet TAKE 1 TABLET(15 MG) BY MOUTH DAILY (Patient not taking: Reported on 10/11/2019)   No facility-administered medications prior to visit.    Review of Systems  Constitutional: Negative for appetite change, chills, fatigue and fever.  Eyes: Negative.   Respiratory: Negative for chest tightness and shortness of breath.   Cardiovascular: Negative for chest pain and palpitations.  Gastrointestinal: Negative for abdominal pain, nausea and vomiting.  Endocrine: Negative.   Musculoskeletal: Negative.   Allergic/Immunologic: Negative.   Neurological: Negative for dizziness and weakness.  Hematological: Negative.   Psychiatric/Behavioral: Negative.     Last hemoglobin A1c Lab Results  Component Value Date   HGBA1C 6.7 (A) 10/11/2019      Objective    BP 106/72 (BP Location: Right Arm, Patient Position: Sitting, Cuff Size: Normal)   Pulse 99   Temp (!) 97.1 F (36.2 C) (Temporal)   Ht 5\' 2"  (1.575 m)   Wt 150 lb 6.4 oz (68.2 kg)   SpO2 99%   BMI 27.51 kg/m  BP Readings from Last 3 Encounters:  10/11/19 106/72  07/12/19 (!) 141/82  06/09/19 121/79   Wt Readings from Last 3 Encounters:  10/11/19 150 lb 6.4 oz (68.2 kg)  07/12/19 146 lb (66.2 kg)  06/09/19 145 lb (65.8 kg)      Physical Exam Vitals reviewed.  Constitutional:      Appearance: Normal appearance.  HENT:     Head: Normocephalic and atraumatic.  Eyes:     General: No scleral icterus.    Conjunctiva/sclera: Conjunctivae normal.  Neck:     Vascular: No carotid bruit.  Cardiovascular:     Rate and Rhythm: Normal rate and regular rhythm.     Pulses: Normal pulses.     Heart sounds: Normal heart sounds.  Pulmonary:     Effort: Pulmonary effort is  normal.     Breath sounds: Normal breath sounds.  Musculoskeletal:     Cervical back: Normal range of motion.     Right lower leg: No edema.     Left lower leg: No edema.  Skin:    General: Skin is warm and dry.  Neurological:     General: No focal deficit present.     Mental Status: She is alert and oriented to person, place, and time.  Psychiatric:        Mood and Affect: Mood normal.        Behavior: Behavior normal.        Thought Content: Thought content normal.        Judgment: Judgment normal.   A1c good at 6.7.   No results found for any visits on 10/11/19.  Assessment & Plan     Problem List Items Addressed This Visit      Cardiovascular and Mediastinum   BP (high blood pressure)    Well controlled Continue current  medications Recheck metabolic panel F/u in 4 months      Relevant Orders   CBC with Differential/Platelet   Lipid panel   Comprehensive metabolic panel   TSH     Digestive   Acid reflux    Controlled       Relevant Orders   CBC with Differential/Platelet   Lipid panel   Comprehensive metabolic panel   TSH     Endocrine   Diabetes mellitus, type 2 (HCC) - Primary    Hemoglobin a1c - 6.7 Stable Continue current medication  Recheck routine labs       Relevant Orders   POCT HgB A1C (Completed)   CBC with Differential/Platelet   Lipid panel   Comprehensive metabolic panel   TSH     Other   Hypercholesteremia    Stable  Continue current medication  Work on diet and exercise      Relevant Orders   CBC with Differential/Platelet   Lipid panel   Comprehensive metabolic panel   TSH     No follow-ups on file.      I, [name], MD, have reviewed all documentation for this visit. The documentation on 10/15/19 for the exam, diagnosis, procedures, and orders are all accurate and complete.    Erline Siddoway Cranford Mon, MD  [facility] [phone] (phone) [phone] (fax)  [employer]

## 2019-10-08 ENCOUNTER — Other Ambulatory Visit: Payer: Self-pay | Admitting: Family Medicine

## 2019-10-08 DIAGNOSIS — M25511 Pain in right shoulder: Secondary | ICD-10-CM

## 2019-10-08 NOTE — Telephone Encounter (Signed)
Requested Prescriptions  Pending Prescriptions Disp Refills  . meloxicam (MOBIC) 15 MG tablet [Pharmacy Med Name: MELOXICAM 15MG  TABLETS] 90 tablet 0    Sig: TAKE 1 TABLET(15 MG) BY MOUTH DAILY     Analgesics:  COX2 Inhibitors Passed - 10/08/2019  8:18 AM      Passed - HGB in normal range and within 360 days    Hemoglobin  Date Value Ref Range Status  01/14/2019 13.3 11.1 - 15.9 g/dL Final         Passed - Cr in normal range and within 360 days    Creatinine, Ser  Date Value Ref Range Status  01/14/2019 0.78 0.57 - 1.00 mg/dL Final         Passed - Patient is not pregnant      Passed - Valid encounter within last 12 months    Recent Outpatient Visits          2 months ago Type 2 diabetes mellitus without complication, unspecified whether long term insulin use [facility])   [facility] Jerrol Banana., MD   4 months ago Acute pain of right shoulder   [facility] Virginia Crews, MD   7 months ago Need for immunization against influenza   [facility] Jerrol Banana., MD   8 months ago Right flank pain   [facility] Jerrol Banana., MD   8 months ago Right flank pain   [facility], Clearnce Sorrel, [place]      Future Appointments            In 3 days Jerrol Banana., MD [facility], [place]

## 2019-10-11 ENCOUNTER — Encounter: Payer: Self-pay | Admitting: Family Medicine

## 2019-10-11 ENCOUNTER — Ambulatory Visit (INDEPENDENT_AMBULATORY_CARE_PROVIDER_SITE_OTHER): Payer: PRIVATE HEALTH INSURANCE | Admitting: Family Medicine

## 2019-10-11 ENCOUNTER — Other Ambulatory Visit: Payer: Self-pay

## 2019-10-11 VITALS — BP 106/72 | HR 99 | Temp 97.1°F | Ht 62.0 in | Wt 150.4 lb

## 2019-10-11 DIAGNOSIS — K219 Gastro-esophageal reflux disease without esophagitis: Secondary | ICD-10-CM | POA: Diagnosis not present

## 2019-10-11 DIAGNOSIS — E7800 Pure hypercholesterolemia, unspecified: Secondary | ICD-10-CM | POA: Diagnosis not present

## 2019-10-11 DIAGNOSIS — E119 Type 2 diabetes mellitus without complications: Secondary | ICD-10-CM

## 2019-10-11 DIAGNOSIS — I10 Essential (primary) hypertension: Secondary | ICD-10-CM

## 2019-10-11 LAB — POCT GLYCOSYLATED HEMOGLOBIN (HGB A1C)
Estimated Average Glucose: 146
Hemoglobin A1C: 6.7 % — AB (ref 4.0–5.6)

## 2019-10-11 NOTE — Assessment & Plan Note (Signed)
Stable  Continue current medication  Work on diet and exercise

## 2019-10-11 NOTE — Assessment & Plan Note (Signed)
Well controlled Continue current medications Recheck metabolic panel F/u in 4 months  

## 2019-10-11 NOTE — Assessment & Plan Note (Signed)
Hemoglobin a1c - 6.7 Stable Continue current medication  Recheck routine labs

## 2019-10-11 NOTE — Assessment & Plan Note (Signed)
Controlled.  

## 2020-01-06 ENCOUNTER — Other Ambulatory Visit: Payer: Self-pay | Admitting: Family Medicine

## 2020-01-06 DIAGNOSIS — M25511 Pain in right shoulder: Secondary | ICD-10-CM

## 2020-01-06 NOTE — Telephone Encounter (Signed)
Requested  medications are  due for refill today yes  Requested medications are on the active medication list yes  Last refill 5/15  Notes to clinic Pt stated to pharmacy that she does not take this

## 2020-02-21 NOTE — Progress Notes (Signed)
I,Broderic Bara,acting as a scribe for Andrea Durie, MD.,have documented all relevant documentation on the behalf of Andrea Durie, MD,as directed by  Andrea Durie, MD while in the presence of Andrea Durie, MD.   Complete physical exam   Patient: Andrea Soto   DOB: [birth year]   59 y.o. Female  MRN: [id number] Visit Date: 02/22/2020  Today's healthcare provider: [name], MD   Chief Complaint  Patient presents with  . Annual Exam   Subjective    Andrea Soto is a 59 y.o. female who presents today for a complete physical exam.  She reports consuming a general diet. The patient does not participate in regular exercise at present. She generally feels well. She reports sleeping well. She does not have additional problems to discuss today.  Patient is now retired.  Overall she feels well. HPI                                  Past Medical History:  Diagnosis Date  . Arthritis    shoulders  . Breast cyst, left    solitary cyst benign  . Depression   . Diabetes mellitus, type 2 ([place])   . Family history of adverse reaction to anesthesia    Son - PONV  . GERD (gastroesophageal reflux disease)   . Hyperlipidemia   . Hypertension    Past Surgical History:  Procedure Laterality Date  . BREAST BIOPSY  1978  . COLONOSCOPY WITH PROPOFOL N/A 10/22/2018   Procedure: COLONOSCOPY WITH PROPOFOL;  Surgeon: [name], MD;  Location: [place];  Service: Endoscopy;  Laterality: N/A;  Diabetic - oral meds  . ENDOMETRIAL BIOPSY    . HYSTEROSCOPY  03/04/2006  . POLYPECTOMY N/A 10/22/2018   Procedure: POLYPECTOMY;  Surgeon: [name], MD;  Location: [place];  Service: Endoscopy;  Laterality: N/A;   Social History   Socioeconomic History  . Marital status: Married    Spouse name: [name]  . Number of children: 2  . Years of education: 69  . Highest education level: Not on file  Occupational History  . Occupation: [employer]  Tobacco  Use  . Smoking status: Never Smoker  . Smokeless tobacco: Never Used  Vaping Use  . Vaping Use: Never used  Substance and Sexual Activity  . Alcohol use: Yes    Alcohol/week: 1.0 standard drink    Types: 1 Glasses of wine per week    Comment: once a month  . Drug use: No  . Sexual activity: Yes    Birth control/protection: Post-menopausal  Other Topics Concern  . Not on file  Social History Narrative  . Not on file   Social Determinants of Health   Financial Resource Strain:   . Difficulty of Paying Living Expenses: Not on file  Food Insecurity:   . Worried About Charity fundraiser in the Last Year: Not on file  . Ran Out of Food in the Last Year: Not on file  Transportation Needs:   . Lack of Transportation (Medical): Not on file  . Lack of Transportation (Non-Medical): Not on file  Physical Activity:   . Days of Exercise per Week: Not on file  . Minutes of Exercise per Session: Not on file  Stress:   . Feeling of Stress : Not on file  Social Connections:   . Frequency of Communication with Friends and Family: Not  on file  . Frequency of Social Gatherings with Friends and Family: Not on file  . Attends Religious Services: Not on file  . Active Member of Clubs or Organizations: Not on file  . Attends Archivist Meetings: Not on file  . Marital Status: Not on file  Intimate Partner Violence:   . Fear of Current or Ex-Partner: Not on file  . Emotionally Abused: Not on file  . Physically Abused: Not on file  . Sexually Abused: Not on file   Family Status  Relation Name Status  . Mother  Alive  . Father  Deceased at age 89  . Sister  Alive  . Daughter  Alive  . Son  Alive  . Brother  Alive  . PGM  Deceased   Family History  Problem Relation Age of Onset  . Hypertension Mother   . Allergic rhinitis Mother   . Cancer Father        multiple myeloma and bladder cancer.  Marland Kitchen Heart attack Father   . Multiple myeloma Father   . Hypertension Sister   .  Epilepsy Daughter   . Seizures Daughter   . Allergic rhinitis Son   . Breast cancer Paternal Grandmother 64  . Bone cancer Paternal Grandmother    Allergies  Allergen Reactions  . Ampicillin     Unsure what reaction was  . Augmentin [Amoxicillin-Pot Clavulanate] Diarrhea  . Adhesive [Tape] Rash    Band-aids    Patient Care Team: Jerrol Banana., MD as PCP - General (Family Medicine)   Medications: Outpatient Medications Prior to Visit  Medication Sig  . aspirin 81 MG tablet Take by mouth.  Marland Kitchen atorvastatin (LIPITOR) 40 MG tablet Take 1 tablet (40 mg total) by mouth daily.  . Coenzyme Q10 (COQ10) 200 MG CAPS Take by mouth.  . Cyanocobalamin (VITAMIN B-12 PO) Take by mouth.  . losartan (COZAAR) 50 MG tablet Take 1 tablet (50 mg total) by mouth daily.  . meloxicam (MOBIC) 15 MG tablet TAKE 1 TABLET(15 MG) BY MOUTH DAILY  . metFORMIN (GLUCOPHAGE) 1000 MG tablet TAKE 1 TABLET BY MOUTH TWO  TIMES DAILY WITH A MEAL  . sertraline (ZOLOFT) 100 MG tablet TAKE 1 TABLET BY MOUTH  DAILY  . esomeprazole (NEXIUM) 20 MG capsule Take 20 mg by mouth daily at 12 noon.  . hydrochlorothiazide (HYDRODIURIL) 25 MG tablet TAKE 1 TABLET BY MOUTH  DAILY (Patient not taking: Reported on 10/11/2019)   No facility-administered medications prior to visit.    Review of Systems  Allergic/Immunologic: Positive for environmental allergies.  All other systems reviewed and are negative.     Objective    BP 116/80 (BP Location: Right Arm, Patient Position: Sitting, Cuff Size: Large)   Pulse 91   Temp 98.8 F (37.1 C) (Oral)   Resp 16   Ht _0  (1.575 m)   Wt 150 lb (68 kg)   SpO2 98%   BMI 27.44 kg/m    Physical Exam Vitals reviewed.  Constitutional:      Appearance: She is well-developed.  HENT:     Head: Normocephalic and atraumatic.     Right Ear: External ear normal.     Left Ear: External ear normal.     Nose: Nose normal.  Eyes:     General: No scleral icterus.     Conjunctiva/sclera: Conjunctivae normal.  Neck:     Thyroid: No thyromegaly.  Cardiovascular:     Rate and Rhythm: Normal rate and  regular rhythm.     Heart sounds: Normal heart sounds.  Pulmonary:     Effort: Pulmonary effort is normal.     Breath sounds: Normal breath sounds.  Abdominal:     Palpations: Abdomen is soft.  Musculoskeletal:     Right lower leg: No edema.     Left lower leg: No edema.  Lymphadenopathy:     Cervical: No cervical adenopathy.  Skin:    General: Skin is warm and dry.  Neurological:     General: No focal deficit present.     Mental Status: She is alert and oriented to person, place, and time.     Comments: Normal monofilament foot exam  Psychiatric:        Mood and Affect: Mood normal.        Behavior: Behavior normal.        Thought Content: Thought content normal.        Judgment: Judgment normal.         Last depression screening scores PHQ 2/9 Scores 02/22/2020 10/05/2018 06/11/2017  PHQ - 2 Score 0 0 0  PHQ- 9 Score 1 0 0   Last fall risk screening Fall Risk  01/29/2016  Falls in the past year? No   Last Audit-C alcohol use screening Alcohol Use Disorder Test (AUDIT) 02/22/2020  1. How often do you have a drink containing alcohol? 1  2. How many drinks containing alcohol do you have on a typical day when you are drinking? 0  3. How often do you have six or more drinks on one occasion? 0  AUDIT-C Score 1  Alcohol Brief Interventions/Follow-up AUDIT Score <7 follow-up not indicated   A score of 3 or more in women, and 4 or more in men indicates increased risk for alcohol abuse, EXCEPT if all of the points are from question 1   Results for orders placed or performed in visit on 02/22/20  POCT UA - Microalbumin  Result Value Ref Range   Microalbumin Ur, POC 50 mg/L    Assessment & Plan    Routine Health Maintenance and Physical Exam  Exercise Activities and Dietary recommendations Goals   None     Immunization History   Administered Date(s) Administered  . Influenza,inj,Quad PF,6+ Mos 03/08/2015, 01/29/2016, 02/09/2017, 02/15/2018, 03/08/2019, 02/22/2020  . PFIZER SARS-COV-2 Vaccination 08/17/2019, 09/07/2019  . Pneumococcal Polysaccharide-23 04/08/2012  . Tdap 07/21/2007, 02/22/2020    Health Maintenance  Topic Date Due  . HIV Screening  Never done  . MAMMOGRAM  08/08/2019  . OPHTHALMOLOGY EXAM  10/19/2019  . HEMOGLOBIN A1C  04/12/2020  . PAP SMEAR-Modifier  07/03/2020  . FOOT EXAM  02/21/2021  . COLONOSCOPY  10/22/2023  . TETANUS/TDAP  02/21/2030  . INFLUENZA VACCINE  Completed  . PNEUMOCOCCAL POLYSACCHARIDE VACCINE AGE 70-64 HIGH RISK  Completed  . COVID-19 Vaccine  Completed  . Hepatitis C Screening  Completed    Discussed health benefits of physical activity, and encouraged her to engage in regular exercise appropriate for her age and condition.  1. Annual physical exam Well woman exam per [facility] - Hemoglobin A1c - CBC w/Diff/Platelet - Comprehensive Metabolic Panel (CMET) - Lipid panel - TSH  2. Need for influenza vaccination  - Flu Vaccine QUAD 36+ mos IM  3. Need for Tdap vaccination  - Tdap vaccine greater than or equal to 7yo IM  4. Type 2 diabetes mellitus without complication, unspecified whether long term insulin use (HCC) Monofilament foot exam is normal today. -  Hemoglobin A1c - CBC w/Diff/Platelet - Comprehensive Metabolic Panel (CMET) - Lipid panel - TSH - POCT UA - Microalbumin--Abnormal 50 mg/L  5. Essential hypertension  - Hemoglobin A1c - CBC w/Diff/Platelet - Comprehensive Metabolic Panel (CMET) - Lipid panel - TSH  6. Hypercholesteremia  - Hemoglobin A1c - CBC w/Diff/Platelet - Comprehensive Metabolic Panel (CMET) - Lipid panel - TSH   Return in about 4 months (around 06/23/2020).     I, Andrea Durie, MD, have reviewed all documentation for this visit. The documentation on 02/23/20 for the exam, diagnosis, procedures, and orders  are all accurate and complete.    Richard Cranford Mon, MD  [facility] [phone] (phone) [phone] (fax)  [place]

## 2020-02-22 ENCOUNTER — Ambulatory Visit (INDEPENDENT_AMBULATORY_CARE_PROVIDER_SITE_OTHER): Payer: PRIVATE HEALTH INSURANCE | Admitting: Family Medicine

## 2020-02-22 ENCOUNTER — Encounter: Payer: Self-pay | Admitting: Family Medicine

## 2020-02-22 ENCOUNTER — Other Ambulatory Visit: Payer: Self-pay

## 2020-02-22 VITALS — BP 116/80 | HR 91 | Temp 98.8°F | Resp 16 | Ht 62.0 in | Wt 150.0 lb

## 2020-02-22 DIAGNOSIS — Z23 Encounter for immunization: Secondary | ICD-10-CM

## 2020-02-22 DIAGNOSIS — I10 Essential (primary) hypertension: Secondary | ICD-10-CM

## 2020-02-22 DIAGNOSIS — E119 Type 2 diabetes mellitus without complications: Secondary | ICD-10-CM | POA: Diagnosis not present

## 2020-02-22 DIAGNOSIS — E7800 Pure hypercholesterolemia, unspecified: Secondary | ICD-10-CM

## 2020-02-22 DIAGNOSIS — Z0000 Encounter for general adult medical examination without abnormal findings: Secondary | ICD-10-CM | POA: Diagnosis not present

## 2020-02-22 LAB — POCT UA - MICROALBUMIN: Microalbumin Ur, POC: 50 mg/L

## 2020-03-27 IMAGING — CR RIGHT RIBS - 2 VIEW
1 series · 2 of 2 positions shown · non-contrast
Comparison: 11/13/2014

CLINICAL DATA: Right sided lateral rib pain upon palpation. No
known injury. Per physician request: rule out compression fx with
referring symptoms. Patient states that pain is better at night and
while laying down resting. Area of pain marked with radiopaque bb.

EXAM:
RIGHT RIBS - 2 VIEW

[Series 1: dg ribs unilateral right · 0.14mm/px · 2 of 2 slices shown]
[im 1/2]
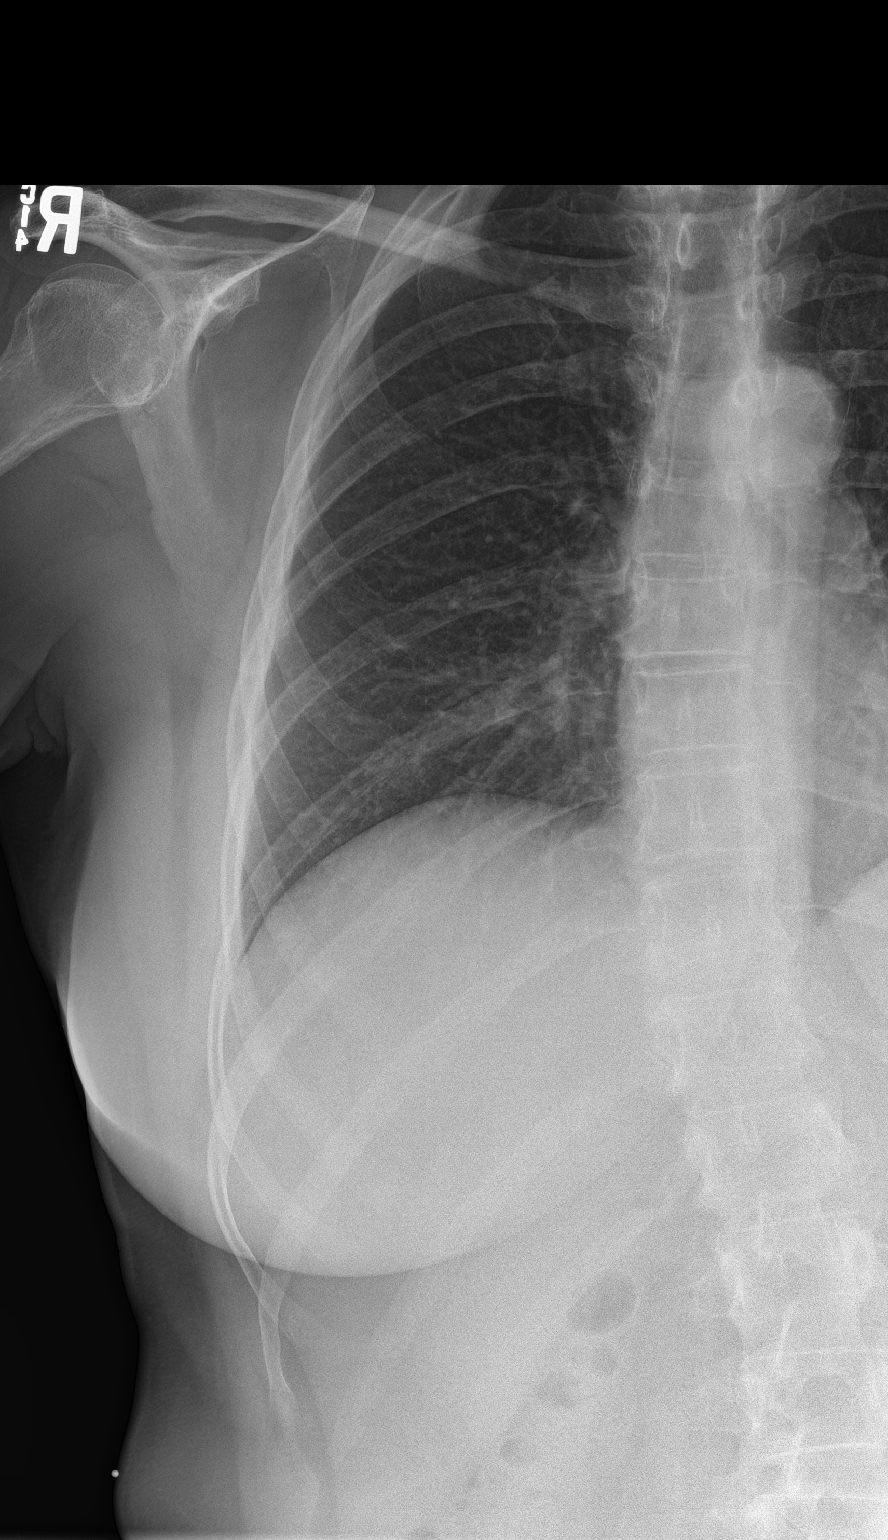
[im 2/2]
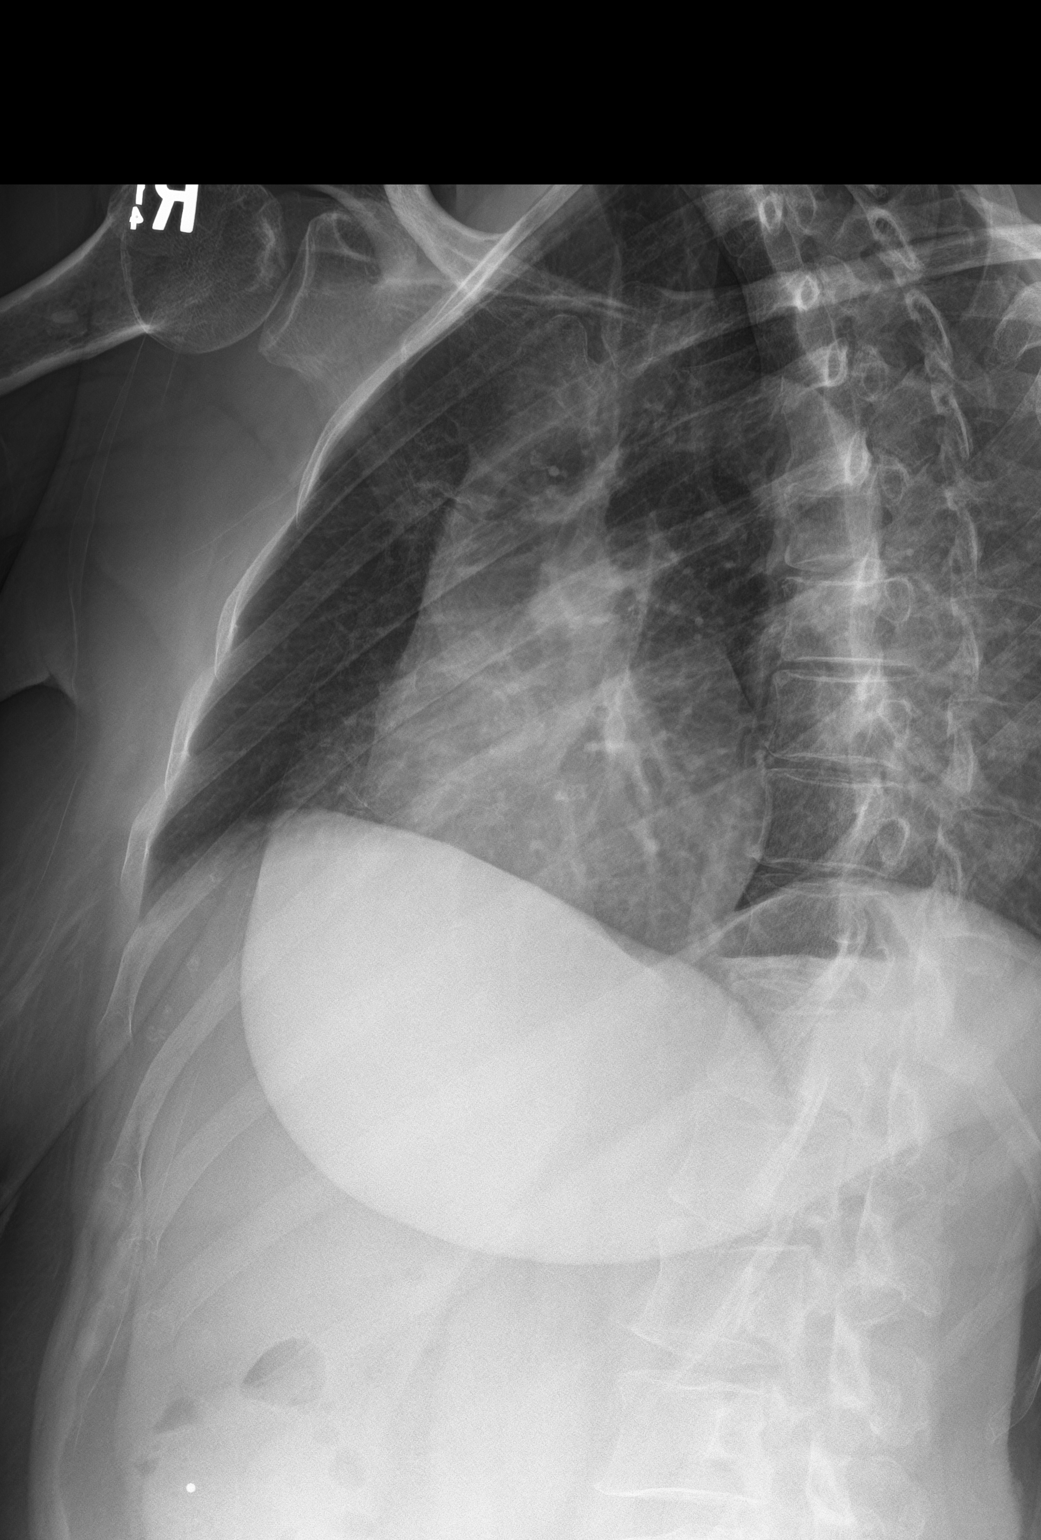

[2 of 2 positions shown; findings below may reference images not displayed]

FINDINGS: No fracture or other bone lesions are seen involving the ribs.
IMPRESSION: Negative.

## 2020-04-04 ENCOUNTER — Other Ambulatory Visit: Payer: Self-pay | Admitting: Family Medicine

## 2020-04-04 DIAGNOSIS — M25511 Pain in right shoulder: Secondary | ICD-10-CM

## 2020-04-04 NOTE — Telephone Encounter (Signed)
Requested medications are due for refill today?  Yes  Requested medications are on active medication list?  Yes  Last Refill:   01/14/2020 # 90 with one refill    Future visit scheduled?  Yes in two months.    Notes to Clinic:  Medication failed Rx refill protocol due to no labs within the past 365 days.   Last labs were performed on 01/14/2019.

## 2020-04-09 ENCOUNTER — Other Ambulatory Visit: Payer: Self-pay | Admitting: *Deleted

## 2020-04-09 DIAGNOSIS — I10 Essential (primary) hypertension: Secondary | ICD-10-CM

## 2020-04-09 DIAGNOSIS — E119 Type 2 diabetes mellitus without complications: Secondary | ICD-10-CM

## 2020-04-09 MED ORDER — SERTRALINE HCL 100 MG PO TABS
100.0000 mg | ORAL_TABLET | Freq: Every day | ORAL | 1 refills | Status: DC
Start: 2020-04-09 — End: 2020-08-22

## 2020-04-09 MED ORDER — METFORMIN HCL 1000 MG PO TABS: ORAL_TABLET | ORAL | 3 refills | Status: DC

## 2020-04-09 MED ORDER — LOSARTAN POTASSIUM 50 MG PO TABS
50.0000 mg | ORAL_TABLET | Freq: Every day | ORAL | 3 refills | Status: DC
Start: 2020-04-09 — End: 2021-07-02

## 2020-04-21 IMAGING — US US ABDOMEN COMPLETE
1 series · 14 of 25 positions shown · non-contrast
Comparison: None.

CLINICAL DATA: Generalized abdominal pain for 3 months.

EXAM:
ABDOMEN ULTRASOUND COMPLETE

[Series 1: us abdomen complete · 14 of 136 slices shown]
[im 1/136]
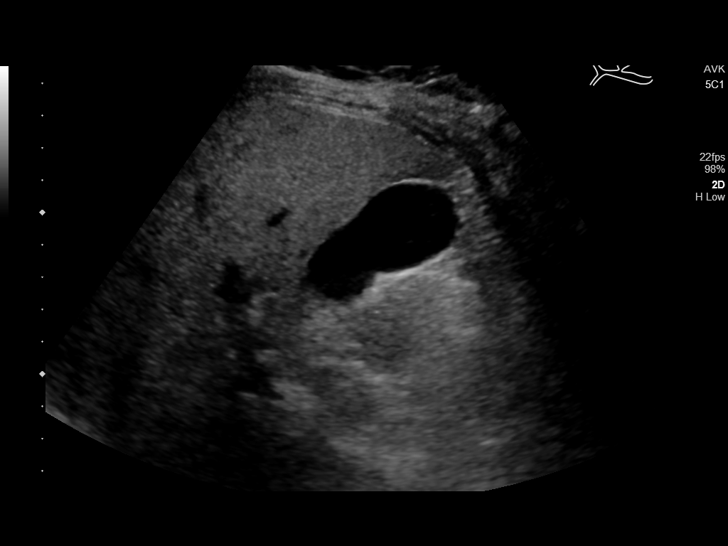
[im 12/136]
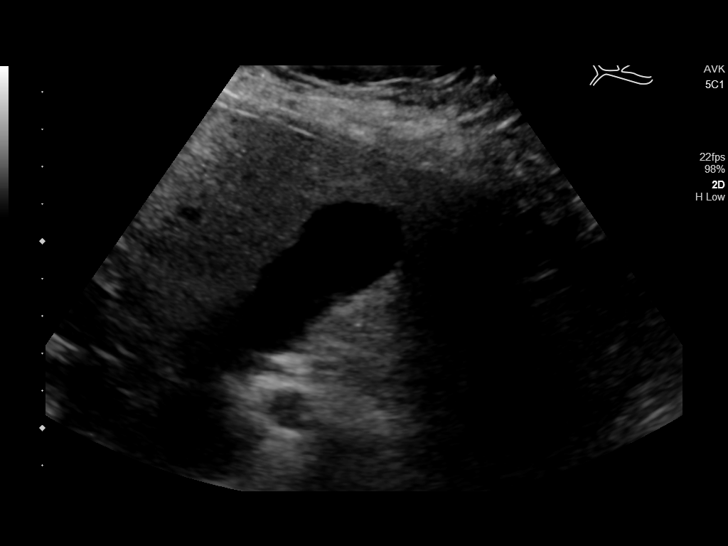
[im 23/136]
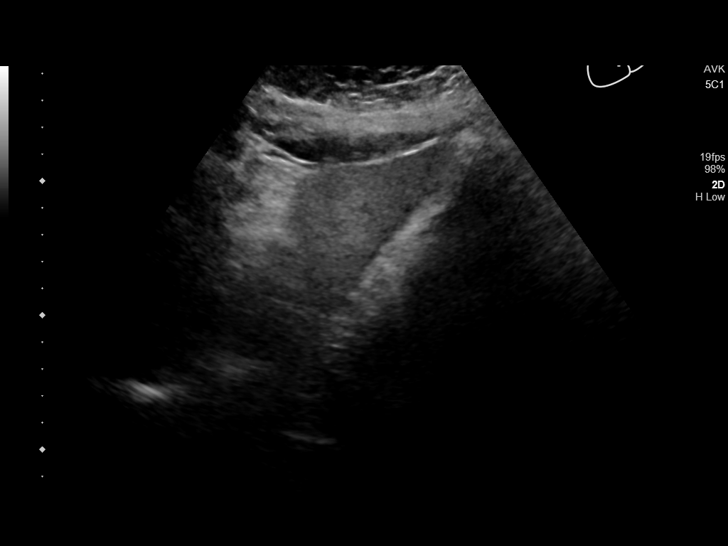
[im 34/136]
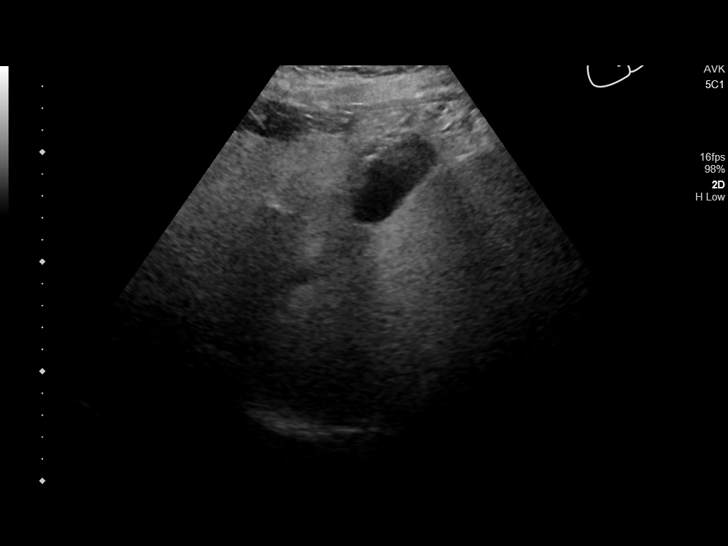
[im 46/136]
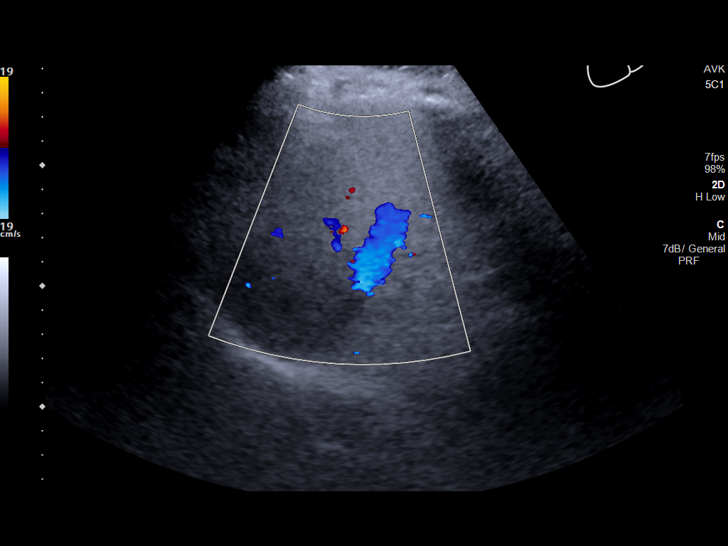
[im 51/136]
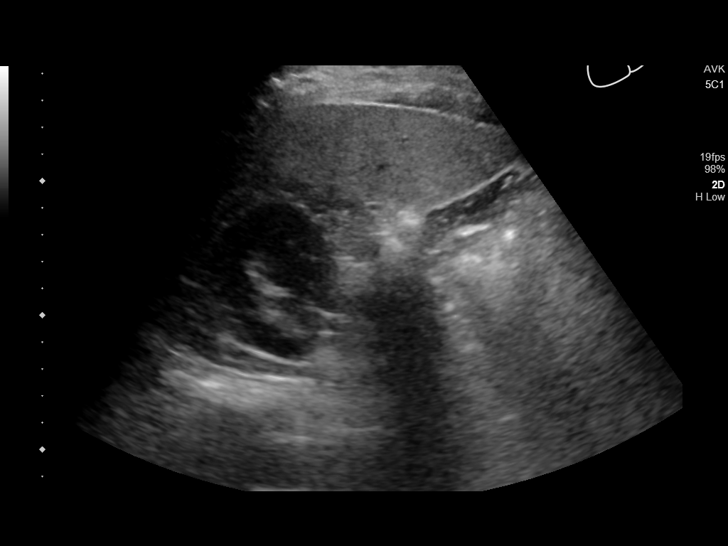
[im 62/136]
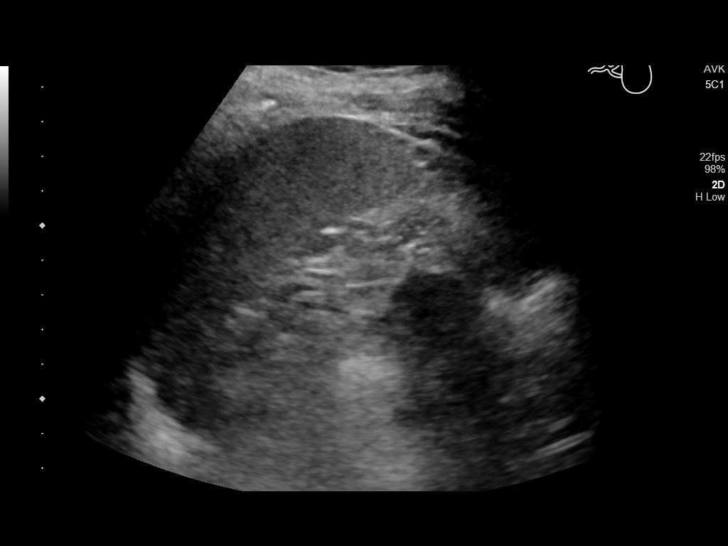
[im 74/136]
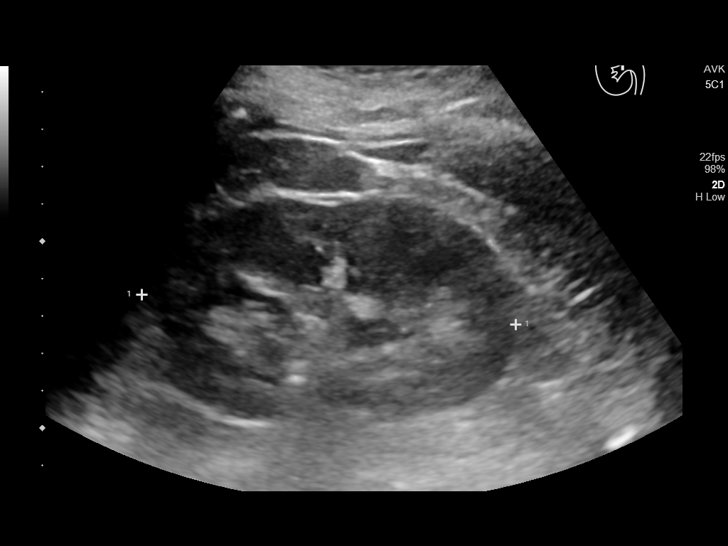
[im 85/136]
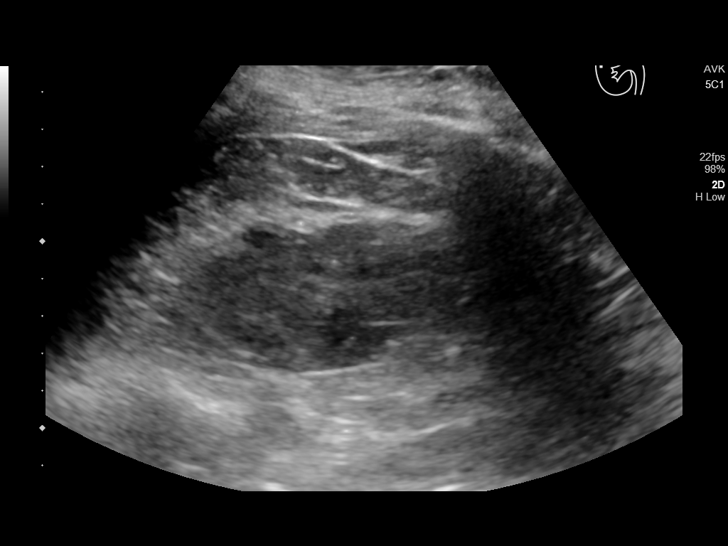
[im 91/136]
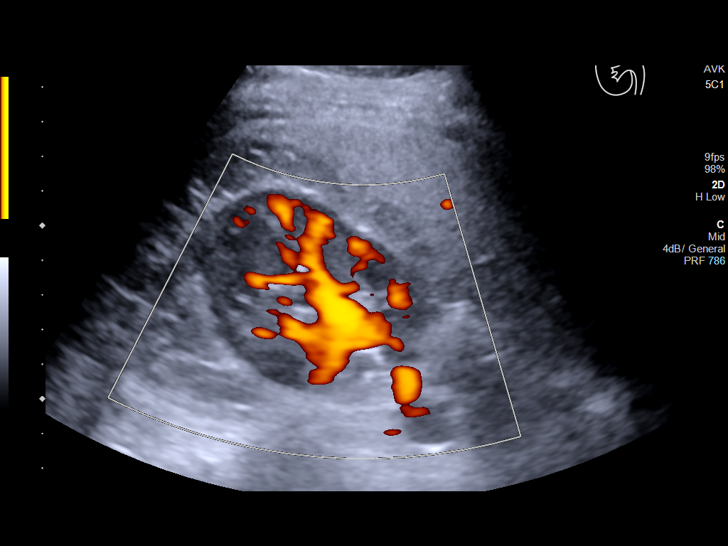
[im 102/136]
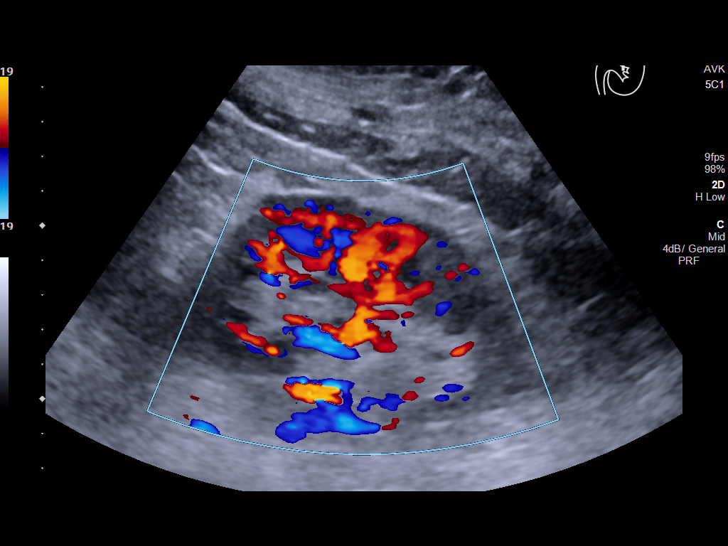
[im 113/136]
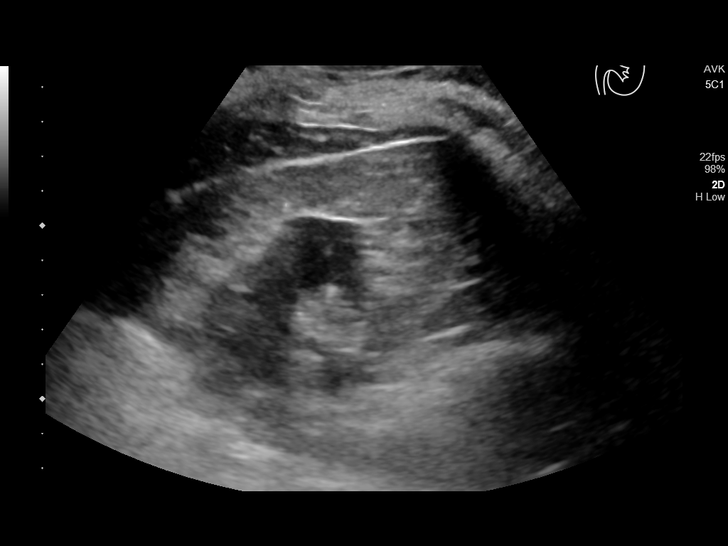
[im 124/136]
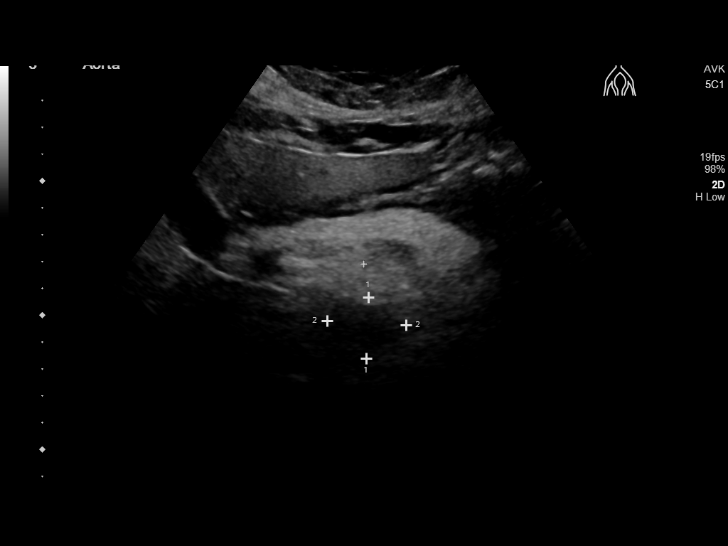
[im 136/136]
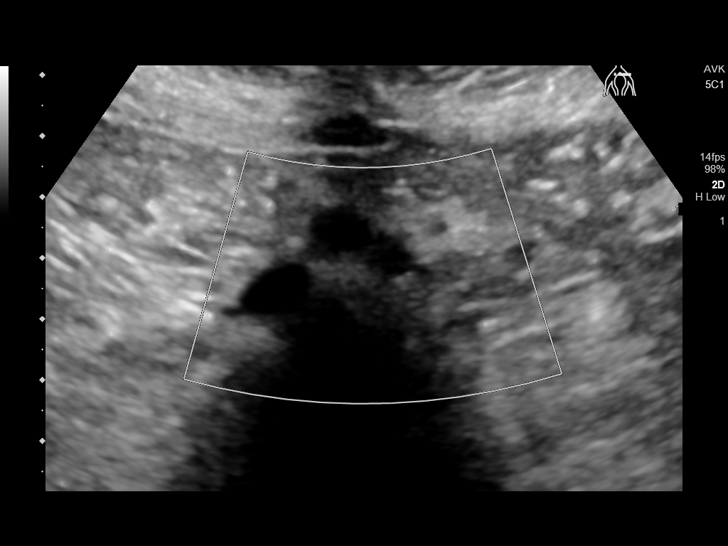

[14 of 25 positions shown; findings below may reference images not displayed]

FINDINGS: Gallbladder: No gallstones or wall thickening visualized. No
sonographic Murphy sign noted by sonographer.

Common bile duct: Diameter: 4 mm which is within normal limits.

Liver: No focal lesion identified. Increased echogenicity of hepatic
parenchyma is noted. Portal vein is patent on color Doppler imaging
with normal direction of blood flow towards the liver.

IVC: No abnormality visualized.

Pancreas: Visualized portion unremarkable.

Spleen: Size and appearance within normal limits.

Right Kidney: Length: 10 cm. Echogenicity within normal limits. No
mass or hydronephrosis visualized.

Left Kidney: Length: 10 cm. Echogenicity within normal limits. No
mass or hydronephrosis visualized.

Abdominal aorta: No aneurysm visualized.

Other findings: None.
IMPRESSION: Increased echogenicity of hepatic parenchyma is noted suggesting
hepatic steatosis or other diffuse hepatocellular disease. No other
abnormality seen in the abdomen.

## 2020-06-22 NOTE — Progress Notes (Signed)
Established patient visit   Patient: Andrea Soto   DOB: November 08, 1960   60 y.o. Female  MRN: [id number] Visit Date: 06/25/2020  Today's healthcare provider: [name], MD   No chief complaint on file.  Subjective    HPI   Patient overall doing well but having increased stress with mother with dementia.  Is involved with her daily care. Patient is not checking her sugar at home.  She diet but not much exercise. She has a recurrence of her shoulder pain from last year.  She has significant pain with any abduction of the shoulder.  Patient enjoying  her 3 grandchildren Diabetes Mellitus Type II, follow-up  Lab Results  Component Value Date   HGBA1C 6.7 (A) 10/11/2019   HGBA1C 6.9 (A) 07/12/2019   HGBA1C 6.5 (A) 03/08/2019   Last seen for diabetes 4 months ago.  Management since then includes; labs ordered, but zero done. She reports good compliance with treatment. She is not having side effects.   Home blood sugar records: Not being checked.  Episodes of hypoglycemia? No    Current insulin regiment: none  Most Recent Eye Exam:   --------------------------------------------------------------------------------------------------- Hypertension, follow-up  BP Readings from Last 3 Encounters:  06/25/20 126/68  02/22/20 116/80  10/11/19 106/72   Wt Readings from Last 3 Encounters:  06/25/20 151 lb (68.5 kg)  02/22/20 150 lb (68 kg)  10/11/19 150 lb 6.4 oz (68.2 kg)     She was last seen for hypertension 4 months ago.  BP at that visit was 116/80. Management since that visit includes; on losartan and HCTZ. She reports good compliance with treatment. She is not having side effects.  She is not exercising. She is adherent to low salt diet.   Outside blood pressures are not being chewcked..  She does not smoke.  Use of agents associated with hypertension: none.    --------------------------------------------------------------------------------------------------- Lipid/Cholesterol, follow-up  Last Lipid Panel: Lab Results  Component Value Date   CHOL 152 10/05/2018   LDLCALC 76 10/05/2018   HDL 35 (L) 10/05/2018   TRIG 206 (H) 10/05/2018    She was last seen for this 10/05/2018.  Management since that visit includes; on atorvastatin.  She reports good compliance with treatment. She is not having side effects.    Last metabolic panel Lab Results  Component Value Date   GLUCOSE 173 (H) 01/14/2019   NA 139 01/14/2019   K 3.6 01/14/2019   BUN 18 01/14/2019   CREATININE 0.78 01/14/2019   GFRNONAA 83 01/14/2019   GFRAA 96 01/14/2019   CALCIUM 9.7 01/14/2019   AST 18 01/14/2019   ALT 20 01/14/2019   The 10-year ASCVD risk score Mikey Bussing DC Jr., et al., 2013) is: 10.7%  --------------------------------------------------------------------------------------------------- Patient did not have her labs done after the last visit due to her insurance coverage.  She is fasting today and will get them after the visit.     Medications: Outpatient Medications Prior to Visit  Medication Sig  . aspirin 81 MG tablet Take by mouth.  Marland Kitchen atorvastatin (LIPITOR) 40 MG tablet Take 1 tablet (40 mg total) by mouth daily.  . Coenzyme Q10 (COQ10) 200 MG CAPS Take by mouth.  . Cyanocobalamin (VITAMIN B-12 PO) Take by mouth.  . losartan (COZAAR) 50 MG tablet Take 1 tablet (50 mg total) by mouth daily.  . meloxicam (MOBIC) 15 MG tablet TAKE 1 TABLET(15 MG) BY MOUTH DAILY  . metFORMIN (GLUCOPHAGE) 1000 MG tablet TAKE 1 TABLET BY  MOUTH TWO  TIMES DAILY WITH A MEAL  . sertraline (ZOLOFT) 100 MG tablet Take 1 tablet (100 mg total) by mouth daily.  Marland Kitchen esomeprazole (NEXIUM) 20 MG capsule Take 20 mg by mouth daily at 12 noon.  . hydrochlorothiazide (HYDRODIURIL) 25 MG tablet TAKE 1 TABLET BY MOUTH  DAILY (Patient not taking: Reported on 06/25/2020)   No  facility-administered medications prior to visit.   Review of Systems  Constitutional: Negative for appetite change, chills, fatigue and fever.  Respiratory: Negative for chest tightness and shortness of breath.   Cardiovascular: Negative for chest pain and palpitations.  Gastrointestinal: Negative for abdominal pain, nausea and vomiting.  Neurological: Negative for dizziness, weakness and headaches.       Objective    BP 126/68 (BP Location: Right Arm, Patient Position: Sitting, Cuff Size: Normal)   Pulse 77   Temp 98.2 F (36.8 C) (Oral)   Wt 151 lb (68.5 kg)   SpO2 99%   BMI 27.62 kg/m      Physical Exam Vitals reviewed.  Constitutional:      Appearance: Normal appearance.  HENT:     Head: Normocephalic and atraumatic.  Eyes:     General: No scleral icterus.    Conjunctiva/sclera: Conjunctivae normal.  Neck:     Vascular: No carotid bruit.  Cardiovascular:     Rate and Rhythm: Normal rate and regular rhythm.     Pulses: Normal pulses.     Heart sounds: Normal heart sounds.  Pulmonary:     Effort: Pulmonary effort is normal.     Breath sounds: Normal breath sounds.  Musculoskeletal:     Cervical back: Normal range of motion.     Right lower leg: No edema.     Left lower leg: No edema.     Comments: Significant pain with AB duction of the right shoulder, some point tenderness also.  Skin:    General: Skin is warm and dry.  Neurological:     General: No focal deficit present.     Mental Status: She is alert and oriented to person, place, and time.  Psychiatric:        Mood and Affect: Mood normal.        Behavior: Behavior normal.        Thought Content: Thought content normal.        Judgment: Judgment normal.       No results found for any visits on 06/25/20.  Assessment & Plan     1. Type 2 diabetes mellitus without complication, unspecified whether long term insulin use (HCC) Obtain A1c.  Start checking blood sugars in the morning.  Follow-up 4  months. Check all labs today. 2. Primary hypertension Good control  3. Depression, major, single episode, mild (HCC) Clinically stable, Follow  situation for now with her mother  4. Hypercholesteremia On Lipitor  5. Chronic right shoulder pain For back to orthopedics Dr. Marry Guan   No follow-ups on file.      I, [name], MD, have reviewed all documentation for this visit. The documentation on 06/25/20 for the exam, diagnosis, procedures, and orders are all accurate and complete.    Kashena Novitski Cranford Mon, MD  [facility] [phone] (phone) [phone] (fax)  [place]

## 2020-06-25 ENCOUNTER — Other Ambulatory Visit: Payer: Self-pay

## 2020-06-25 ENCOUNTER — Ambulatory Visit (INDEPENDENT_AMBULATORY_CARE_PROVIDER_SITE_OTHER): Payer: 59 | Admitting: Family Medicine

## 2020-06-25 VITALS — BP 126/68 | HR 77 | Temp 98.2°F | Wt 151.0 lb

## 2020-06-25 DIAGNOSIS — M25511 Pain in right shoulder: Secondary | ICD-10-CM

## 2020-06-25 DIAGNOSIS — E119 Type 2 diabetes mellitus without complications: Secondary | ICD-10-CM

## 2020-06-25 DIAGNOSIS — I10 Essential (primary) hypertension: Secondary | ICD-10-CM | POA: Diagnosis not present

## 2020-06-25 DIAGNOSIS — F320 Major depressive disorder, single episode, mild: Secondary | ICD-10-CM

## 2020-06-25 DIAGNOSIS — E7800 Pure hypercholesterolemia, unspecified: Secondary | ICD-10-CM

## 2020-06-25 DIAGNOSIS — G8929 Other chronic pain: Secondary | ICD-10-CM

## 2020-06-27 LAB — COMPREHENSIVE METABOLIC PANEL
ALT: 27 IU/L (ref 0–32)
AST: 20 IU/L (ref 0–40)
Albumin/Globulin Ratio: 1.7 (ref 1.2–2.2)
Albumin: 4.5 g/dL (ref 3.8–4.8)
Alkaline Phosphatase: 75 IU/L (ref 44–121)
BUN/Creatinine Ratio: 18 (ref 12–28)
BUN: 13 mg/dL (ref 8–27)
Bilirubin Total: 0.2 mg/dL (ref 0.0–1.2)
CO2: 25 mmol/L (ref 20–29)
Calcium: 9.1 mg/dL (ref 8.7–10.3)
Chloride: 99 mmol/L (ref 96–106)
Creatinine, Ser: 0.73 mg/dL (ref 0.57–1.00)
GFR calc Af Amer: 102 mL/min/{1.73_m2} (ref >59)
GFR calc non Af Amer: 89 mL/min/{1.73_m2} (ref >59)
Globulin, Total: 2.7 g/dL (ref 1.5–4.5)
Glucose: 111 mg/dL — ABNORMAL HIGH (ref 65–99)
Potassium: 4.4 mmol/L (ref 3.5–5.2)
Sodium: 138 mmol/L (ref 134–144)
Total Protein: 7.2 g/dL (ref 6.0–8.5)

## 2020-06-27 LAB — CBC WITH DIFFERENTIAL/PLATELET
Basophils Absolute: 0.1 10*3/uL (ref 0.0–0.2)
Basos: 1 %
EOS (ABSOLUTE): 0.2 10*3/uL (ref 0.0–0.4)
Eos: 2 %
Hematocrit: 38.2 % (ref 34.0–46.6)
Hemoglobin: 12.3 g/dL (ref 11.1–15.9)
Immature Grans (Abs): 0 10*3/uL (ref 0.0–0.1)
Immature Granulocytes: 0 %
Lymphocytes Absolute: 1.9 10*3/uL (ref 0.7–3.1)
Lymphs: 27 %
MCH: 26.1 pg — ABNORMAL LOW (ref 26.6–33.0)
MCHC: 32.2 g/dL (ref 31.5–35.7)
MCV: 81 fL (ref 79–97)
Monocytes Absolute: 0.5 10*3/uL (ref 0.1–0.9)
Monocytes: 6 %
Neutrophils Absolute: 4.5 10*3/uL (ref 1.4–7.0)
Neutrophils: 64 %
Platelets: 276 10*3/uL (ref 150–450)
RBC: 4.71 x10E6/uL (ref 3.77–5.28)
RDW: 12.7 % (ref 11.7–15.4)
WBC: 7 10*3/uL (ref 3.4–10.8)

## 2020-06-27 LAB — LIPID PANEL
Chol/HDL Ratio: 4 ratio (ref 0.0–4.4)
Cholesterol, Total: 149 mg/dL (ref 100–199)
HDL: 37 mg/dL — ABNORMAL LOW (ref >39)
LDL Chol Calc (NIH): 81 mg/dL (ref 0–99)
Triglycerides: 180 mg/dL — ABNORMAL HIGH (ref 0–149)
VLDL Cholesterol Cal: 31 mg/dL (ref 5–40)

## 2020-06-27 LAB — HEMOGLOBIN A1C
Est. average glucose Bld gHb Est-mCnc: 157 mg/dL
Hgb A1c MFr Bld: 7.1 % — ABNORMAL HIGH (ref 4.8–5.6)

## 2020-06-27 LAB — TSH: TSH: 3.27 u[IU]/mL (ref 0.450–4.500)

## 2020-07-03 ENCOUNTER — Other Ambulatory Visit: Payer: Self-pay | Admitting: Family Medicine

## 2020-07-03 DIAGNOSIS — M25511 Pain in right shoulder: Secondary | ICD-10-CM

## 2020-07-03 DIAGNOSIS — E7800 Pure hypercholesterolemia, unspecified: Secondary | ICD-10-CM

## 2020-07-04 ENCOUNTER — Ambulatory Visit: Payer: 59

## 2020-07-04 ENCOUNTER — Other Ambulatory Visit: Payer: Self-pay | Admitting: Family Medicine

## 2020-07-04 ENCOUNTER — Other Ambulatory Visit: Payer: Self-pay

## 2020-07-04 DIAGNOSIS — E119 Type 2 diabetes mellitus without complications: Secondary | ICD-10-CM

## 2020-07-08 ENCOUNTER — Other Ambulatory Visit: Payer: Self-pay | Admitting: Family Medicine

## 2020-07-08 DIAGNOSIS — I10 Essential (primary) hypertension: Secondary | ICD-10-CM

## 2020-08-08 ENCOUNTER — Ambulatory Visit: Payer: 59

## 2020-08-08 ENCOUNTER — Other Ambulatory Visit: Payer: Self-pay

## 2020-08-16 ENCOUNTER — Other Ambulatory Visit: Payer: Self-pay | Admitting: Family Medicine

## 2020-08-21 ENCOUNTER — Other Ambulatory Visit: Payer: Self-pay

## 2020-08-21 NOTE — Telephone Encounter (Signed)
Patient is out of refills on Zoloft. Please call to [facility] in [place]

## 2020-08-22 ENCOUNTER — Ambulatory Visit (INDEPENDENT_AMBULATORY_CARE_PROVIDER_SITE_OTHER): Payer: 59

## 2020-08-22 ENCOUNTER — Other Ambulatory Visit: Payer: Self-pay

## 2020-08-22 DIAGNOSIS — Z23 Encounter for immunization: Secondary | ICD-10-CM

## 2020-08-22 MED ORDER — SERTRALINE HCL 100 MG PO TABS
100.0000 mg | ORAL_TABLET | Freq: Every day | ORAL | 1 refills | Status: DC
Start: 2020-08-22 — End: 2021-01-02

## 2020-08-24 ENCOUNTER — Encounter: Payer: Self-pay | Admitting: Family Medicine

## 2020-08-31 LAB — HM DIABETES EYE EXAM

## 2020-09-11 ENCOUNTER — Telehealth: Payer: Self-pay

## 2020-09-11 NOTE — Telephone Encounter (Signed)
Copied from [place] [id number]. Topic: General - Inquiry >> Sep 11, 2020 10:43 AM Scherrie Gerlach wrote: Leeanne Rio with [employer] calling needing you to fax today the office note from 02/22/20.  She states they have been trying to get this for some time.  She is resending fax again and provided her fax number. Fax [phone] The pone number listed is her cell.  If you call, please you the reference number as well

## 2020-09-14 NOTE — Telephone Encounter (Signed)
This was processed by [facility] on 07/11/20 and the OV note from 02/22/20 was included in the records that [facility] processed. I will forward the request that was received on 09/11/2020 to [place], [place] for [facility]. TNP

## 2020-09-29 ENCOUNTER — Other Ambulatory Visit: Payer: Self-pay | Admitting: Family Medicine

## 2020-09-29 DIAGNOSIS — E7800 Pure hypercholesterolemia, unspecified: Secondary | ICD-10-CM

## 2020-09-29 NOTE — Telephone Encounter (Signed)
Requested Prescriptions  Pending Prescriptions Disp Refills  . atorvastatin (LIPITOR) 40 MG tablet [Pharmacy Med Name: ATORVASTATIN 40MG  TABLETS] 90 tablet 0    Sig: TAKE 1 TABLET(40 MG) BY MOUTH DAILY     Cardiovascular:  Antilipid - Statins Failed - 09/29/2020  3:17 AM      Failed - HDL in normal range and within 360 days    HDL  Date Value Ref Range Status  06/25/2020 37 (L) >39 mg/dL Final         Failed - Triglycerides in normal range and within 360 days    Triglycerides  Date Value Ref Range Status  06/25/2020 180 (H) 0 - 149 mg/dL Final         Passed - Total Cholesterol in normal range and within 360 days    Cholesterol, Total  Date Value Ref Range Status  06/25/2020 149 100 - 199 mg/dL Final         Passed - LDL in normal range and within 360 days    LDL Chol Calc (NIH)  Date Value Ref Range Status  06/25/2020 81 0 - 99 mg/dL Final         Passed - Patient is not pregnant      Passed - Valid encounter within last 12 months    Recent Outpatient Visits          3 months ago Type 2 diabetes mellitus without complication, unspecified whether long term insulin use ([place])   [facility] Andrea Soto., MD   7 months ago Annual physical exam   [facility] Andrea Soto., MD   11 months ago Type 2 diabetes mellitus without complication, unspecified whether long term insulin use [facility])   [facility] Andrea Soto., MD   1 year ago Type 2 diabetes mellitus without complication, unspecified whether long term insulin use [facility])   [facility] Andrea Soto., MD   1 year ago Acute pain of right shoulder   [facility] Virginia Crews, MD      Future Appointments            In 2 weeks Andrea Soto., MD [facility]

## 2020-10-16 NOTE — Progress Notes (Signed)
Established patient visit   Patient: Andrea Soto   DOB: 10/26/1960   60 y.o. Female  MRN: [id number] Visit Date: 10/17/2020  Today's healthcare provider: [name], MD   Chief Complaint  Patient presents with  . Diabetes   Subjective    HPI  No complaints.  Patient now completely tired and enjoying being a grandmother. Diabetes Mellitus Type II, Follow-up  Lab Results  Component Value Date   HGBA1C 6.9 (A) 10/17/2020   HGBA1C 7.1 (H) 06/25/2020   HGBA1C 6.7 (A) 10/11/2019   Wt Readings from Last 3 Encounters:  10/17/20 149 lb 12.8 oz (67.9 kg)  06/25/20 151 lb (68.5 kg)  02/22/20 150 lb (68 kg)   Last seen for diabetes 4 months ago.  Management since then includes no medication changes. She reports excellent compliance with treatment. She is not having side effects.   Symptoms: No fatigue No foot ulcerations  No appetite changes No nausea  No paresthesia of the feet  No polydipsia  No polyuria No visual disturbances   No vomiting     Home blood sugar records: not being checked  Episodes of hypoglycemia? No     Current insulin regiment: none Most Recent Eye Exam: patient reports March 2022 Current exercise: none Current diet habits: well balanced  Pertinent Labs: Lab Results  Component Value Date   CHOL 149 06/25/2020   HDL 37 (L) 06/25/2020   LDLCALC 81 06/25/2020   TRIG 180 (H) 06/25/2020   CHOLHDL 4.0 06/25/2020   Lab Results  Component Value Date   NA 138 06/25/2020   K 4.4 06/25/2020   CREATININE 0.73 06/25/2020   GFRNONAA 89 06/25/2020   GFRAA 102 06/25/2020   GLUCOSE 111 (H) 06/25/2020       Patient Active Problem List   Diagnosis Date Noted  . Special screening for malignant neoplasms, colon   . Benign neoplasm of cecum   . 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 Depression, major, single episode, mild ([place]) 10/14/2014  . Acid reflux 10/14/2014  . H/O transient cerebral ischemia 10/14/2014  . Hypercholesteremia  10/14/2014  . BP (high blood pressure) 10/14/2014  . 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 Diabetes mellitus, type 2 ([place]) 10/14/2014   Past Medical History:  Diagnosis Date  . Arthritis    shoulders  . Breast cyst, left    solitary cyst benign  . Depression   . Diabetes mellitus, type 2 ([place])   . Family history of adverse reaction to anesthesia    Son - PONV  . GERD (gastroesophageal reflux disease)   . Hyperlipidemia   . Hypertension    Allergies  Allergen Reactions  . Ampicillin     Unsure what reaction was  . Augmentin [Amoxicillin-Pot Clavulanate] Diarrhea  . Adhesive [Tape] Rash    Band-aids       Medications: Outpatient Medications Prior to Visit  Medication Sig  . aspirin 81 MG tablet Take by mouth.  Marland Kitchen atorvastatin (LIPITOR) 40 MG tablet TAKE 1 TABLET(40 MG) BY MOUTH DAILY  . Coenzyme Q10 (COQ10) 200 MG CAPS Take by mouth.  . Cyanocobalamin (VITAMIN B-12 PO) Take by mouth.  . losartan (COZAAR) 50 MG tablet Take 1 tablet (50 mg total) by mouth daily.  . meloxicam (MOBIC) 15 MG tablet TAKE 1 TABLET(15 MG) BY MOUTH DAILY  . metFORMIN (GLUCOPHAGE) 1000 MG tablet TAKE 1 TABLET BY MOUTH TWO  TIMES DAILY WITH A MEAL  . sertraline (ZOLOFT) 100 MG tablet Take 1 tablet (100  mg total) by mouth daily.  . [DISCONTINUED] esomeprazole (NEXIUM) 20 MG capsule Take 20 mg by mouth daily at 12 noon.  . [DISCONTINUED] hydrochlorothiazide (HYDRODIURIL) 25 MG tablet TAKE 1 TABLET BY MOUTH  DAILY (Patient not taking: Reported on 06/25/2020)   No facility-administered medications prior to visit.    Review of Systems      Objective    BP 130/80   Pulse 92   Resp 16   Wt 149 lb 12.8 oz (67.9 kg)   SpO2 98%   BMI 27.40 kg/m  BP Readings from Last 3 Encounters:  10/17/20 130/80  06/25/20 126/68  02/22/20 116/80   Wt Readings from Last 3 Encounters:  10/17/20 149 lb 12.8 oz (67.9 kg)  06/25/20 151 lb (68.5 kg)  02/22/20 150 lb (68 kg)       Physical Exam Vitals reviewed.   Constitutional:      Appearance: Normal appearance.  HENT:     Head: Normocephalic and atraumatic.  Eyes:     General: No scleral icterus.    Conjunctiva/sclera: Conjunctivae normal.  Neck:     Vascular: No carotid bruit.  Cardiovascular:     Rate and Rhythm: Normal rate and regular rhythm.     Pulses: Normal pulses.     Heart sounds: Normal heart sounds.  Pulmonary:     Effort: Pulmonary effort is normal.     Breath sounds: Normal breath sounds.  Abdominal:     Palpations: Abdomen is soft.  Musculoskeletal:     Cervical back: Normal range of motion.     Right lower leg: No edema.     Left lower leg: No edema.  Skin:    General: Skin is warm and dry.  Neurological:     General: No focal deficit present.     Mental Status: She is alert and oriented to person, place, and time.  Psychiatric:        Mood and Affect: Mood normal.        Behavior: Behavior normal.        Thought Content: Thought content normal.        Judgment: Judgment normal.       Results for orders placed or performed in visit on 10/17/20  POCT glycosylated hemoglobin (Hb A1C)  Result Value Ref Range   Hemoglobin A1C 6.9 (A) 4.0 - 5.6 %   HbA1c POC (<> result, manual entry)     HbA1c, POC (prediabetic range)     HbA1c, POC (controlled diabetic range)      Assessment & Plan     1. Type 2 diabetes mellitus without complication, unspecified whether long term insulin use (HCC) It is under good control with an A1c of 6.9 on metformin. - POCT glycosylated hemoglobin (Hb A1C)  2. Primary hypertension Good control on losartan 50  3. Depression, major, single episode, mild (HCC) Controlled on sertraline 50.  Reassess at the end of the year  4. Hypercholesteremia On atorvastatin   No follow-ups on file.      I, [name], MD, have reviewed all documentation for this visit. The documentation on 10/22/20 for the exam, diagnosis, procedures, and orders are all accurate and  complete.    Jashley Yellin Cranford Mon, MD  [facility] [phone] (phone) [phone] (fax)  [place]

## 2020-10-17 ENCOUNTER — Other Ambulatory Visit: Payer: Self-pay

## 2020-10-17 ENCOUNTER — Ambulatory Visit (INDEPENDENT_AMBULATORY_CARE_PROVIDER_SITE_OTHER): Payer: 59 | Admitting: Family Medicine

## 2020-10-17 ENCOUNTER — Encounter: Payer: Self-pay | Admitting: Family Medicine

## 2020-10-17 VITALS — BP 130/80 | HR 92 | Resp 16 | Wt 149.8 lb

## 2020-10-17 DIAGNOSIS — I10 Essential (primary) hypertension: Secondary | ICD-10-CM | POA: Diagnosis not present

## 2020-10-17 DIAGNOSIS — E7800 Pure hypercholesterolemia, unspecified: Secondary | ICD-10-CM | POA: Diagnosis not present

## 2020-10-17 DIAGNOSIS — F320 Major depressive disorder, single episode, mild: Secondary | ICD-10-CM

## 2020-10-17 DIAGNOSIS — E119 Type 2 diabetes mellitus without complications: Secondary | ICD-10-CM | POA: Diagnosis not present

## 2020-10-17 LAB — POCT GLYCOSYLATED HEMOGLOBIN (HGB A1C): Hemoglobin A1C: 6.9 % — AB (ref 4.0–5.6)

## 2020-12-12 ENCOUNTER — Telehealth: Payer: Self-pay

## 2020-12-12 MED ORDER — NIRMATRELVIR/RITONAVIR (PAXLOVID)TABLET: 3.0000 | ORAL_TABLET | Freq: Two times a day (BID) | ORAL | 0 refills | Status: AC

## 2020-12-12 NOTE — Telephone Encounter (Signed)
Ordered as below. Patient advised.

## 2020-12-12 NOTE — Telephone Encounter (Signed)
Copied from [place] [id number]. Topic: Appointment Scheduling - Scheduling Inquiry for Clinic >> Dec 12, 2020  8:50 AM Lennox Solders wrote: Reason for CRM: Pt tested positive today with home covid test and having sore throat, nasal and bodyaches. Pt would like to get covid antiviral treatment. Pt husband tested positive yesterday.Please advise

## 2020-12-12 NOTE — Telephone Encounter (Signed)
Ok to send in [place]?

## 2020-12-28 ENCOUNTER — Telehealth: Payer: Self-pay

## 2020-12-28 DIAGNOSIS — E7800 Pure hypercholesterolemia, unspecified: Secondary | ICD-10-CM

## 2020-12-28 NOTE — Telephone Encounter (Signed)
[facility] faxed refill request for the following medications:  atorvastatin (LIPITOR) 40 MG tablet   Please advise.  

## 2020-12-31 MED ORDER — ATORVASTATIN CALCIUM 40 MG PO TABS: ORAL_TABLET | ORAL | 1 refills | Status: DC

## 2020-12-31 NOTE — Telephone Encounter (Signed)
Rx sent to pharmacy   

## 2021-01-02 ENCOUNTER — Other Ambulatory Visit: Payer: Self-pay | Admitting: *Deleted

## 2021-01-02 DIAGNOSIS — F320 Major depressive disorder, single episode, mild: Secondary | ICD-10-CM

## 2021-01-02 DIAGNOSIS — E119 Type 2 diabetes mellitus without complications: Secondary | ICD-10-CM

## 2021-01-02 MED ORDER — SERTRALINE HCL 100 MG PO TABS: 100.0000 mg | ORAL_TABLET | Freq: Every day | ORAL | 1 refills | Status: DC

## 2021-01-02 MED ORDER — METFORMIN HCL 1000 MG PO TABS: ORAL_TABLET | ORAL | 3 refills | Status: DC

## 2021-02-27 ENCOUNTER — Encounter: Payer: Self-pay | Admitting: Family Medicine

## 2021-02-27 ENCOUNTER — Other Ambulatory Visit: Payer: Self-pay

## 2021-02-27 ENCOUNTER — Ambulatory Visit (INDEPENDENT_AMBULATORY_CARE_PROVIDER_SITE_OTHER): Payer: 59 | Admitting: Family Medicine

## 2021-02-27 VITALS — BP 124/82 | HR 94 | Temp 98.3°F | Resp 16 | Ht 62.0 in | Wt 147.0 lb

## 2021-02-27 DIAGNOSIS — E119 Type 2 diabetes mellitus without complications: Secondary | ICD-10-CM

## 2021-02-27 DIAGNOSIS — I10 Essential (primary) hypertension: Secondary | ICD-10-CM | POA: Diagnosis not present

## 2021-02-27 DIAGNOSIS — K219 Gastro-esophageal reflux disease without esophagitis: Secondary | ICD-10-CM

## 2021-02-27 DIAGNOSIS — N390 Urinary tract infection, site not specified: Secondary | ICD-10-CM

## 2021-02-27 DIAGNOSIS — Z23 Encounter for immunization: Secondary | ICD-10-CM

## 2021-02-27 DIAGNOSIS — E7800 Pure hypercholesterolemia, unspecified: Secondary | ICD-10-CM | POA: Diagnosis not present

## 2021-02-27 DIAGNOSIS — Z0000 Encounter for general adult medical examination without abnormal findings: Secondary | ICD-10-CM | POA: Diagnosis not present

## 2021-02-27 DIAGNOSIS — Z1231 Encounter for screening mammogram for malignant neoplasm of breast: Secondary | ICD-10-CM

## 2021-02-27 MED ORDER — NITROFURANTOIN MONOHYD MACRO 100 MG PO CAPS: 100.0000 mg | ORAL_CAPSULE | Freq: Two times a day (BID) | ORAL | 0 refills | Status: DC

## 2021-02-27 NOTE — Progress Notes (Signed)
I,[name],acting as a scribe for Andrea Durie, MD.,have documented all relevant documentation on the behalf of Andrea Durie, MD,as directed by  Andrea Durie, MD while in the presence of Andrea Durie, MD.   Complete physical exam   Patient: Andrea Soto   DOB: 1961/01/19   60 y.o. Female  MRN: [id number] Visit Date: 02/27/2021  Today's healthcare provider: [name], MD   Chief Complaint  Patient presents with   Annual Exam   Subjective    [name] is a 60 y.o. female who presents today for a complete physical exam.  She reports consuming a general diet. The patient does not participate in regular exercise at present. She generally feels fairly well. She reports sleeping fairly well. She does not have additional problems to discuss today.  HPI  She sees PT tomorrow for right rotator cuff problem. She sees GYN for well woman care at [facility]. She sees Dr. Phillip Heal from dermatology She had sent some malodorous urine recently and urinary pressure.  No frank dysuria.  Past Medical History:  Diagnosis Date   Arthritis    shoulders   Breast cyst, left    solitary cyst benign   Depression    Diabetes mellitus, type 2 (HCC)    Family history of adverse reaction to anesthesia    Son - PONV   GERD (gastroesophageal reflux disease)    Hyperlipidemia    Hypertension    Past Surgical History:  Procedure Laterality Date   BREAST BIOPSY  1978   COLONOSCOPY WITH PROPOFOL N/A 10/22/2018   Procedure: COLONOSCOPY WITH PROPOFOL;  Surgeon: [name], MD;  Location: [place];  Service: Endoscopy;  Laterality: N/A;  Diabetic - oral meds   ENDOMETRIAL BIOPSY     HYSTEROSCOPY  03/04/2006   POLYPECTOMY N/A 10/22/2018   Procedure: POLYPECTOMY;  Surgeon: [name], MD;  Location: [place];  Service: Endoscopy;  Laterality: N/A;   Social History   Socioeconomic History   Marital status: Married    Spouse name: Andrea Soto    Number of children: 2   Years of education: 16   Highest education level: Not on file  Occupational History   Occupation: [employer]  Tobacco Use   Smoking status: Never   Smokeless tobacco: Never  Vaping Use   Vaping Use: Never used  Substance and Sexual Activity   Alcohol use: Yes    Alcohol/week: 1.0 standard drink    Types: 1 Glasses of wine per week    Comment: once a month   Drug use: No   Sexual activity: Yes    Birth control/protection: Post-menopausal  Other Topics Concern   Not on file  Social History Narrative   Not on file   Social Determinants of Health   Financial Resource Strain: Not on file  Food Insecurity: Not on file  Transportation Needs: Not on file  Physical Activity: Not on file  Stress: Not on file  Social Connections: Not on file  Intimate Partner Violence: Not on file   Family Status  Relation Name Status   Mother  Alive   Father  Deceased at age 45   Sister  Alive   Daughter  Alive   Son  [place]   [place]  Deceased   Family History  Problem Relation Age of Onset   Hypertension Mother    Allergic rhinitis Mother    Cancer Father  multiple myeloma and bladder cancer.   Heart attack Father    Multiple myeloma Father    Hypertension Sister    Epilepsy Daughter    Seizures Daughter    Allergic rhinitis Son    Breast cancer Paternal Grandmother 33   Bone cancer Paternal Grandmother    Allergies  Allergen Reactions   Ampicillin     Unsure what reaction was   Augmentin [Amoxicillin-Pot Clavulanate] Diarrhea   Adhesive [Tape] Rash    Band-aids    Patient Care Team: Jerrol Banana., MD as PCP - General (Family Medicine)   Medications: Outpatient Medications Prior to Visit  Medication Sig   aspirin 81 MG tablet Take by mouth.   atorvastatin (LIPITOR) 40 MG tablet TAKE 1 TABLET(40 MG) BY MOUTH DAILY   Coenzyme Q10 (COQ10) 200 MG CAPS Take by mouth.   Cyanocobalamin (VITAMIN B-12 PO) Take by mouth.    losartan (COZAAR) 50 MG tablet Take 1 tablet (50 mg total) by mouth daily.   meloxicam (MOBIC) 15 MG tablet TAKE 1 TABLET(15 MG) BY MOUTH DAILY   metFORMIN (GLUCOPHAGE) 1000 MG tablet TAKE 1 TABLET BY MOUTH TWO  TIMES DAILY WITH A MEAL   sertraline (ZOLOFT) 100 MG tablet Take 1 tablet (100 mg total) by mouth daily.   No facility-administered medications prior to visit.    Review of Systems  All other systems reviewed and are negative.  Last hemoglobin A1c Lab Results  Component Value Date   HGBA1C 6.9 (A) 10/17/2020      Objective    BP 124/82 (BP Location: Right Arm, Patient Position: Sitting, Cuff Size: Large)   Pulse 94   Temp 98.3 F (36.8 C) (Oral)   Resp 16   Ht _0  (1.575 m)   Wt 147 lb (66.7 kg)   SpO2 97%   BMI 26.89 kg/m  BP Readings from Last 3 Encounters:  02/27/21 124/82  10/17/20 130/80  06/25/20 126/68   Wt Readings from Last 3 Encounters:  02/27/21 147 lb (66.7 kg)  10/17/20 149 lb 12.8 oz (67.9 kg)  06/25/20 151 lb (68.5 kg)      Physical Exam Vitals reviewed.  Constitutional:      Appearance: She is well-developed.  HENT:     Head: Normocephalic and atraumatic.     Right Ear: External ear normal.     Left Ear: External ear normal.     Nose: Nose normal.  Eyes:     General: No scleral icterus.    Conjunctiva/sclera: Conjunctivae normal.  Neck:     Thyroid: No thyromegaly.  Cardiovascular:     Rate and Rhythm: Normal rate and regular rhythm.     Heart sounds: Normal heart sounds.  Pulmonary:     Effort: Pulmonary effort is normal.     Breath sounds: Normal breath sounds.  Abdominal:     Palpations: Abdomen is soft.  Musculoskeletal:     Right lower leg: No edema.     Left lower leg: No edema.  Lymphadenopathy:     Cervical: No cervical adenopathy.  Skin:    General: Skin is warm and dry.  Neurological:     General: No focal deficit present.     Mental Status: She is alert and oriented to person, place, and time.     Comments:  Normal monofilament foot exam  Psychiatric:        Mood and Affect: Mood normal.        Behavior: Behavior normal.  Thought Content: Thought content normal.        Judgment: Judgment normal.      Last depression screening scores PHQ 2/9 Scores 02/27/2021 02/22/2020 10/05/2018  PHQ - 2 Score 0 0 0  PHQ- 9 Score 2 1 0   Last fall risk screening Fall Risk  02/27/2021  Falls in the past year? 0  Number falls in past yr: 0  Injury with Fall? 0  Risk for fall due to : No Fall Risks  Follow up Falls evaluation completed   Last Audit-C alcohol use screening Alcohol Use Disorder Test (AUDIT) 02/27/2021  1. How often do you have a drink containing alcohol? 1  2. How many drinks containing alcohol do you have on a typical day when you are drinking? 0  3. How often do you have six or more drinks on one occasion? 0  AUDIT-C Score 1  Alcohol Brief Interventions/Follow-up -   A score of 3 or more in women, and 4 or more in men indicates increased risk for alcohol abuse, EXCEPT if all of the points are from question 1   No results found for any visits on 02/27/21.  Assessment & Plan    Routine Health Maintenance and Physical Exam  Exercise Activities and Dietary recommendations  Goals   None     Immunization History  Administered Date(s) Administered   Influenza,inj,Quad PF,6+ Mos 03/08/2015, 01/29/2016, 02/09/2017, 02/15/2018, 03/08/2019, 02/22/2020, 02/27/2021   PFIZER Comirnaty(Gray Top)Covid-19 Tri-Sucrose Vaccine 08/22/2020   PFIZER(Purple Top)SARS-COV-2 Vaccination 08/17/2019, 09/07/2019   Pneumococcal Polysaccharide-23 04/08/2012   Tdap 07/21/2007, 02/22/2020    Health Maintenance  Topic Date Due   HIV Screening  Never done   Zoster Vaccines- Shingrix (1 of 2) Never done   MAMMOGRAM  08/08/2019   PAP SMEAR-Modifier  07/03/2020   COVID-19 Vaccine (4 - Booster for Pfizer series) 12/22/2020   FOOT EXAM  02/21/2021   HEMOGLOBIN A1C  04/19/2021   OPHTHALMOLOGY EXAM   08/31/2021   COLONOSCOPY (Pts 45-49yrs Insurance coverage will need to be confirmed)  10/22/2023   TETANUS/TDAP  02/21/2030   INFLUENZA VACCINE  Completed   Hepatitis C Screening  Completed   HPV VACCINES  Aged Out    Discussed health benefits of physical activity, and encouraged her to engage in regular exercise appropriate for her age and condition.  1. Annual physical exam  - Lipid panel - TSH - CBC w/Diff/Platelet - Comprehensive Metabolic Panel (CMET) - Hemoglobin A1c  2. Essential hypertension  - Lipid panel - TSH - CBC w/Diff/Platelet - Comprehensive Metabolic Panel (CMET) - Hemoglobin A1c  3. Hypercholesteremia  - Lipid panel - TSH - CBC w/Diff/Platelet - Comprehensive Metabolic Panel (CMET) - Hemoglobin A1c  4. Type 2 diabetes mellitus without complication, unspecified whether long term insulin use (HCC)  - Lipid panel - TSH - CBC w/Diff/Platelet - Comprehensive Metabolic Panel (CMET) - Hemoglobin A1c  5. Gastroesophageal reflux disease without esophagitis  - Lipid panel - TSH - CBC w/Diff/Platelet - Comprehensive Metabolic Panel (CMET) - Hemoglobin A1c  6. Need for influenza vaccination  - Flu Vaccine QUAD 49mo+IM (Fluarix, Fluzone & Alfiuria Quad PF)  7. Urinary tract infection without hematuria, site unspecified Push fluids and try cranberry juice daily for symptom - CULTURE, URINE COMPREHENSIVE - nitrofurantoin, macrocrystal-monohydrate, (MACROBID) 100 MG capsule; Take 1 capsule (100 mg total) by mouth 2 (two) times daily.  Dispense: 6 capsule; Refill: 0  8. Encounter for screening mammogram for malignant neoplasm of breast  - MM Digital Screening  Return in about 4 months (around 06/30/2021).     I, Andrea Durie, MD, have reviewed all documentation for this visit. The documentation on 03/04/21 for the exam, diagnosis, procedures, and orders are all accurate and complete.    Bart Ashford Cranford Mon, MD  [facility] [phone] (phone) [phone] (fax)  [place]

## 2021-02-27 NOTE — Patient Instructions (Signed)
PUSH FLUIDS AND CRANBERRY JUICE.

## 2021-03-03 LAB — CULTURE, URINE COMPREHENSIVE

## 2021-06-28 ENCOUNTER — Other Ambulatory Visit: Payer: Self-pay | Admitting: Family Medicine

## 2021-06-28 DIAGNOSIS — E7800 Pure hypercholesterolemia, unspecified: Secondary | ICD-10-CM

## 2021-06-28 NOTE — Telephone Encounter (Signed)
Requested medication (s) are due for refill today -yes  Requested medication (s) are on the active medication list -yes  Future visit scheduled -yes  Last refill: 12/31/20 #90 1RF  Notes to clinic: Request RF: fails lab protocol -06/25/20  Requested Prescriptions  Pending Prescriptions Disp Refills   atorvastatin (LIPITOR) 40 MG tablet [Pharmacy Med Name: ATORVASTATIN 40MG  TABLETS] 90 tablet 1    Sig: TAKE 1 TABLET(40 MG) BY MOUTH DAILY     Cardiovascular:  Antilipid - Statins Failed - 06/28/2021 12:04 PM      Failed - Lipid Panel in normal range within the last 12 months    Cholesterol, Total  Date Value Ref Range Status  06/25/2020 149 100 - 199 mg/dL Final   LDL Chol Calc (NIH)  Date Value Ref Range Status  06/25/2020 81 0 - 99 mg/dL Final   HDL  Date Value Ref Range Status  06/25/2020 37 (L) >39 mg/dL Final   Triglycerides  Date Value Ref Range Status  06/25/2020 180 (H) 0 - 149 mg/dL Final         Passed - Patient is not pregnant      Passed - Valid encounter within last 12 months    Recent Outpatient Visits           4 months ago Annual physical exam   [facility] Jerrol Banana., MD   8 months ago Type 2 diabetes mellitus without complication, unspecified whether long term insulin use ([place])   [facility] Jerrol Banana., MD   1 year ago Type 2 diabetes mellitus without complication, unspecified whether long term insulin use ([place])   [facility] Jerrol Banana., MD   1 year ago Annual physical exam   [facility] Jerrol Banana., MD   1 year ago Type 2 diabetes mellitus without complication, unspecified whether long term insulin use [facility])   [facility] Jerrol Banana., MD       Future Appointments             In 3 days Jerrol Banana., MD [facility]               Requested Prescriptions  Pending Prescriptions Disp  Refills   atorvastatin (LIPITOR) 40 MG tablet [Pharmacy Med Name: ATORVASTATIN 40MG  TABLETS] 90 tablet 1    Sig: TAKE 1 TABLET(40 MG) BY MOUTH DAILY     Cardiovascular:  Antilipid - Statins Failed - 06/28/2021 12:04 PM      Failed - Lipid Panel in normal range within the last 12 months    Cholesterol, Total  Date Value Ref Range Status  06/25/2020 149 100 - 199 mg/dL Final   LDL Chol Calc (NIH)  Date Value Ref Range Status  06/25/2020 81 0 - 99 mg/dL Final   HDL  Date Value Ref Range Status  06/25/2020 37 (L) >39 mg/dL Final   Triglycerides  Date Value Ref Range Status  06/25/2020 180 (H) 0 - 149 mg/dL Final         Passed - Patient is not pregnant      Passed - Valid encounter within last 12 months    Recent Outpatient Visits           4 months ago Annual physical exam   [facility] Jerrol Banana., MD   8 months ago Type 2 diabetes mellitus without complication, unspecified whether long term  insulin use [facility])   [facility] Jerrol Banana., MD   1 year ago Type 2 diabetes mellitus without complication, unspecified whether long term insulin use [facility])   [facility] Jerrol Banana., MD   1 year ago Annual physical exam   [facility] Jerrol Banana., MD   1 year ago Type 2 diabetes mellitus without complication, unspecified whether long term insulin use [facility])   [facility] Jerrol Banana., MD       Future Appointments             In 3 days Jerrol Banana., MD [facility], [place]

## 2021-06-30 ENCOUNTER — Other Ambulatory Visit: Payer: Self-pay | Admitting: Family Medicine

## 2021-06-30 DIAGNOSIS — F320 Major depressive disorder, single episode, mild: Secondary | ICD-10-CM

## 2021-07-01 ENCOUNTER — Ambulatory Visit (INDEPENDENT_AMBULATORY_CARE_PROVIDER_SITE_OTHER): Payer: BC Managed Care – PPO | Admitting: Family Medicine

## 2021-07-01 ENCOUNTER — Other Ambulatory Visit: Payer: Self-pay

## 2021-07-01 ENCOUNTER — Encounter: Payer: Self-pay | Admitting: Family Medicine

## 2021-07-01 VITALS — BP 123/81 | HR 89 | Resp 15 | Wt 146.1 lb

## 2021-07-01 DIAGNOSIS — F320 Major depressive disorder, single episode, mild: Secondary | ICD-10-CM | POA: Diagnosis not present

## 2021-07-01 DIAGNOSIS — M25511 Pain in right shoulder: Secondary | ICD-10-CM

## 2021-07-01 DIAGNOSIS — E7800 Pure hypercholesterolemia, unspecified: Secondary | ICD-10-CM | POA: Diagnosis not present

## 2021-07-01 DIAGNOSIS — Z0000 Encounter for general adult medical examination without abnormal findings: Secondary | ICD-10-CM | POA: Diagnosis not present

## 2021-07-01 DIAGNOSIS — I10 Essential (primary) hypertension: Secondary | ICD-10-CM | POA: Diagnosis not present

## 2021-07-01 DIAGNOSIS — E119 Type 2 diabetes mellitus without complications: Secondary | ICD-10-CM | POA: Diagnosis not present

## 2021-07-01 LAB — POCT GLYCOSYLATED HEMOGLOBIN (HGB A1C): Hemoglobin A1C: 6.8 % — AB (ref 4.0–5.6)

## 2021-07-01 MED ORDER — MELOXICAM 15 MG PO TABS: ORAL_TABLET | ORAL | 0 refills | Status: DC

## 2021-07-01 MED ORDER — BUPROPION HCL ER (XL) 150 MG PO TB24: 150.0000 mg | ORAL_TABLET | Freq: Every day | ORAL | 3 refills | Status: DC

## 2021-07-01 NOTE — Progress Notes (Signed)
Established patient visit   Patient: Andrea Soto   DOB: April 14, 1961   61 y.o. Female  MRN: [id number] Visit Date: 07/01/2021  Today's healthcare provider: [name], MD   Chief Complaint  Patient presents with   Diabetes   Hypertension   Hyperlipidemia   Subjective    HPI  Patient comes in today for follow-up.  She is feeling well but is having a lot of problems with stress as her mother is in failing health.  She feels she has trouble concentrating.  PHQ-9 is 2 but she is willing to try some medications to help her concentration such as Wellbutrin.  She does admit to some anhedonia, she is on Zoloft with no side effects. The other problem is ongoing right shoulder pain for which she needs to see orthopedics.  She has been seen in the past by [facility] orthopedics Diabetes Mellitus Type II, follow-up  Lab Results  Component Value Date   HGBA1C 6.8 (A) 07/01/2021   HGBA1C 6.9 (A) 10/17/2020   HGBA1C 7.1 (H) 06/25/2020   Last seen for diabetes 4 months ago.  Management since then includes; labs ordered, but zero done She reports excellent compliance with treatment. She is not having side effects.   Home blood sugar records:  not checked  Episodes of hypoglycemia? No    Current insulin regiment: none Most Recent Eye Exam: Spring 2022  --------------------------------------------------------------------------------------------------- Hypertension, follow-up  BP Readings from Last 3 Encounters:  07/01/21 123/81  02/27/21 124/82  10/17/20 130/80   Wt Readings from Last 3 Encounters:  07/01/21 146 lb 1.6 oz (66.3 kg)  02/27/21 147 lb (66.7 kg)  10/17/20 149 lb 12.8 oz (67.9 kg)     She was last seen for hypertension 4 months ago.  BP at that visit was 124/82. Management since that visit includes; on losartan. She reports excellent compliance with treatment. She is not having side effects.  She is not exercising. She is adherent to low salt diet.   Outside  blood pressures are checked occasional.  She does not smoke.  Use of agents associated with hypertension: NSAIDS.   --------------------------------------------------------------------------------------------------- Lipid/Cholesterol, follow-up  Last Lipid Panel: Lab Results  Component Value Date   CHOL 149 06/25/2020   LDLCALC 81 06/25/2020   HDL 37 (L) 06/25/2020   TRIG 180 (H) 06/25/2020    She was last seen for this 4 months ago.  Management since that visit includes; on atorvastatin. Labs ordered, but zero done.  She reports excellent compliance with treatment. She is not having side effects.   She is following a Regular diet. Current exercise: no regular exercise  Last metabolic panel Lab Results  Component Value Date   GLUCOSE 111 (H) 06/25/2020   NA 138 06/25/2020   K 4.4 06/25/2020   BUN 13 06/25/2020   CREATININE 0.73 06/25/2020   GFRNONAA 89 06/25/2020   CALCIUM 9.1 06/25/2020   AST 20 06/25/2020   ALT 27 06/25/2020   The 10-year ASCVD risk score (Arnett DK, et al., 2019) is: 10.8%  ---------------------------------------------------------------------------------------------------   Medications: Outpatient Medications Prior to Visit  Medication Sig   aspirin 81 MG tablet Take by mouth.   atorvastatin (LIPITOR) 40 MG tablet TAKE 1 TABLET(40 MG) BY MOUTH DAILY   Coenzyme Q10 (COQ10) 200 MG CAPS Take by mouth.   Cyanocobalamin (VITAMIN B-12 PO) Take by mouth.   losartan (COZAAR) 50 MG tablet Take 1 tablet (50 mg total) by mouth daily.   metFORMIN (GLUCOPHAGE) 1000  MG tablet TAKE 1 TABLET BY MOUTH TWO  TIMES DAILY WITH A MEAL   nitrofurantoin, macrocrystal-monohydrate, (MACROBID) 100 MG capsule Take 1 capsule (100 mg total) by mouth 2 (two) times daily.   sertraline (ZOLOFT) 100 MG tablet Take 1 tablet (100 mg total) by mouth daily.   [DISCONTINUED] meloxicam (MOBIC) 15 MG tablet TAKE 1 TABLET(15 MG) BY MOUTH DAILY   No facility-administered  medications prior to visit.    Review of Systems  Constitutional:  Negative for appetite change, chills, fatigue and fever.  Respiratory:  Negative for chest tightness and shortness of breath.   Cardiovascular:  Negative for chest pain and palpitations.  Gastrointestinal:  Negative for abdominal pain, nausea and vomiting.  Neurological:  Negative for dizziness and weakness.   Last hemoglobin A1c Lab Results  Component Value Date   HGBA1C 6.8 (A) 07/01/2021       Objective    BP 123/81    Pulse 89    Resp 15    Wt 146 lb 1.6 oz (66.3 kg)    SpO2 100%    BMI 26.72 kg/m  BP Readings from Last 3 Encounters:  07/01/21 123/81  02/27/21 124/82  10/17/20 130/80   Wt Readings from Last 3 Encounters:  07/01/21 146 lb 1.6 oz (66.3 kg)  02/27/21 147 lb (66.7 kg)  10/17/20 149 lb 12.8 oz (67.9 kg)      Physical Exam Vitals reviewed.  Constitutional:      Appearance: Normal appearance.  HENT:     Head: Normocephalic and atraumatic.  Eyes:     General: No scleral icterus.    Conjunctiva/sclera: Conjunctivae normal.  Neck:     Vascular: No carotid bruit.  Cardiovascular:     Rate and Rhythm: Normal rate and regular rhythm.     Pulses: Normal pulses.     Heart sounds: Normal heart sounds.  Pulmonary:     Effort: Pulmonary effort is normal.     Breath sounds: Normal breath sounds.  Abdominal:     Palpations: Abdomen is soft.  Musculoskeletal:     Cervical back: Normal range of motion.     Right lower leg: No edema.     Left lower leg: No edema.  Skin:    General: Skin is warm and dry.  Neurological:     General: No focal deficit present.     Mental Status: She is alert and oriented to person, place, and time.  Psychiatric:        Mood and Affect: Mood normal.        Behavior: Behavior normal.        Thought Content: Thought content normal.        Judgment: Judgment normal.      Results for orders placed or performed in visit on 07/01/21  POCT glycosylated  hemoglobin (Hb A1C)  Result Value Ref Range   Hemoglobin A1C 6.8 (A) 4.0 - 5.6 %   HbA1c POC (<> result, manual entry)     HbA1c, POC (prediabetic range)     HbA1c, POC (controlled diabetic range)      Assessment & Plan     1. Acute pain of right shoulder Try meloxicam pending seen orthopedics.  She would like to see a Dr. Jeannie Fend at [facility] who is a shoulder specialist - meloxicam (MOBIC) 15 MG tablet; TAKE 1 TABLET(15 MG) BY MOUTH DAILY  Dispense: 90 tablet; Refill: 0 - AMB referral to orthopedics  2. Type 2 diabetes mellitus without complication, unspecified whether  long term insulin use (HCC) A1c is 6.8 today which is excellent control.  No hypoglycemia. - POCT glycosylated hemoglobin (Hb A1C)  3. Depression, major, single episode, mild (HCC)/possible ADHD Tried Wellbutrin but may need referral for counseling.  Patient is willing to do this. - buPROPion (WELLBUTRIN XL) 150 MG 24 hr tablet; Take 1 tablet (150 mg total) by mouth daily.  Dispense: 30 tablet; Refill: 3   Return in about 4 months (around 10/29/2021).      I, [name], MD, have reviewed all documentation for this visit. The documentation on 07/01/21 for the exam, diagnosis, procedures, and orders are all accurate and complete.  Patient seen and examined by Dr. Miguel Aschoff, note scribed by Jennings Books, [place], MD  [facility] [phone] (phone) [phone] (fax)  [place]

## 2021-07-02 ENCOUNTER — Other Ambulatory Visit: Payer: Self-pay | Admitting: Family Medicine

## 2021-07-02 DIAGNOSIS — I10 Essential (primary) hypertension: Secondary | ICD-10-CM

## 2021-07-02 LAB — CBC WITH DIFFERENTIAL/PLATELET
Basophils Absolute: 0.1 10*3/uL (ref 0.0–0.2)
Basos: 1 %
EOS (ABSOLUTE): 0.2 10*3/uL (ref 0.0–0.4)
Eos: 3 %
Hematocrit: 38.8 % (ref 34.0–46.6)
Hemoglobin: 12.6 g/dL (ref 11.1–15.9)
Immature Grans (Abs): 0 10*3/uL (ref 0.0–0.1)
Immature Granulocytes: 0 %
Lymphocytes Absolute: 2.2 10*3/uL (ref 0.7–3.1)
Lymphs: 25 %
MCH: 26 pg — ABNORMAL LOW (ref 26.6–33.0)
MCHC: 32.5 g/dL (ref 31.5–35.7)
MCV: 80 fL (ref 79–97)
Monocytes Absolute: 0.5 10*3/uL (ref 0.1–0.9)
Monocytes: 6 %
Neutrophils Absolute: 5.6 10*3/uL (ref 1.4–7.0)
Neutrophils: 65 %
Platelets: 283 10*3/uL (ref 150–450)
RBC: 4.84 x10E6/uL (ref 3.77–5.28)
RDW: 13.3 % (ref 11.7–15.4)
WBC: 8.6 10*3/uL (ref 3.4–10.8)

## 2021-07-02 LAB — COMPREHENSIVE METABOLIC PANEL
ALT: 22 IU/L (ref 0–32)
AST: 19 IU/L (ref 0–40)
Albumin/Globulin Ratio: 1.6 (ref 1.2–2.2)
Albumin: 4.6 g/dL (ref 3.8–4.8)
Alkaline Phosphatase: 81 IU/L (ref 44–121)
BUN/Creatinine Ratio: 15 (ref 12–28)
BUN: 13 mg/dL (ref 8–27)
Bilirubin Total: 0.2 mg/dL (ref 0.0–1.2)
CO2: 27 mmol/L (ref 20–29)
Calcium: 9.8 mg/dL (ref 8.7–10.3)
Chloride: 99 mmol/L (ref 96–106)
Creatinine, Ser: 0.84 mg/dL (ref 0.57–1.00)
Globulin, Total: 2.9 g/dL (ref 1.5–4.5)
Glucose: 106 mg/dL — ABNORMAL HIGH (ref 70–99)
Potassium: 4.5 mmol/L (ref 3.5–5.2)
Sodium: 139 mmol/L (ref 134–144)
Total Protein: 7.5 g/dL (ref 6.0–8.5)
eGFR: 78 mL/min/{1.73_m2} (ref >59)

## 2021-07-02 LAB — TSH: TSH: 1.78 u[IU]/mL (ref 0.450–4.500)

## 2021-07-02 LAB — LIPID PANEL
Chol/HDL Ratio: 4.1 ratio (ref 0.0–4.4)
Cholesterol, Total: 161 mg/dL (ref 100–199)
HDL: 39 mg/dL — ABNORMAL LOW (ref >39)
LDL Chol Calc (NIH): 83 mg/dL (ref 0–99)
Triglycerides: 232 mg/dL — ABNORMAL HIGH (ref 0–149)
VLDL Cholesterol Cal: 39 mg/dL (ref 5–40)

## 2021-07-17 DIAGNOSIS — L570 Actinic keratosis: Secondary | ICD-10-CM | POA: Diagnosis not present

## 2021-07-17 DIAGNOSIS — L821 Other seborrheic keratosis: Secondary | ICD-10-CM | POA: Diagnosis not present

## 2021-07-17 DIAGNOSIS — L578 Other skin changes due to chronic exposure to nonionizing radiation: Secondary | ICD-10-CM | POA: Diagnosis not present

## 2021-07-17 DIAGNOSIS — Z86018 Personal history of other benign neoplasm: Secondary | ICD-10-CM | POA: Diagnosis not present

## 2021-07-19 DIAGNOSIS — M25511 Pain in right shoulder: Secondary | ICD-10-CM | POA: Diagnosis not present

## 2021-07-19 DIAGNOSIS — G8929 Other chronic pain: Secondary | ICD-10-CM | POA: Diagnosis not present

## 2021-07-19 DIAGNOSIS — M75101 Unspecified rotator cuff tear or rupture of right shoulder, not specified as traumatic: Secondary | ICD-10-CM | POA: Diagnosis not present

## 2021-08-26 DIAGNOSIS — M75101 Unspecified rotator cuff tear or rupture of right shoulder, not specified as traumatic: Secondary | ICD-10-CM | POA: Diagnosis not present

## 2021-08-26 DIAGNOSIS — M25511 Pain in right shoulder: Secondary | ICD-10-CM | POA: Diagnosis not present

## 2021-08-26 DIAGNOSIS — M75121 Complete rotator cuff tear or rupture of right shoulder, not specified as traumatic: Secondary | ICD-10-CM | POA: Diagnosis not present

## 2021-08-26 DIAGNOSIS — S43431A Superior glenoid labrum lesion of right shoulder, initial encounter: Secondary | ICD-10-CM | POA: Diagnosis not present

## 2021-08-26 DIAGNOSIS — G8929 Other chronic pain: Secondary | ICD-10-CM | POA: Diagnosis not present

## 2021-09-11 DIAGNOSIS — M75101 Unspecified rotator cuff tear or rupture of right shoulder, not specified as traumatic: Secondary | ICD-10-CM | POA: Diagnosis not present

## 2021-09-17 DIAGNOSIS — Z7984 Long term (current) use of oral hypoglycemic drugs: Secondary | ICD-10-CM | POA: Diagnosis not present

## 2021-09-17 DIAGNOSIS — E119 Type 2 diabetes mellitus without complications: Secondary | ICD-10-CM | POA: Diagnosis not present

## 2021-09-17 DIAGNOSIS — Z79899 Other long term (current) drug therapy: Secondary | ICD-10-CM | POA: Diagnosis not present

## 2021-09-17 DIAGNOSIS — Z8673 Personal history of transient ischemic attack (TIA), and cerebral infarction without residual deficits: Secondary | ICD-10-CM | POA: Diagnosis not present

## 2021-09-17 DIAGNOSIS — X58XXXA Exposure to other specified factors, initial encounter: Secondary | ICD-10-CM | POA: Diagnosis not present

## 2021-09-17 DIAGNOSIS — M25811 Other specified joint disorders, right shoulder: Secondary | ICD-10-CM | POA: Diagnosis not present

## 2021-09-17 DIAGNOSIS — M75101 Unspecified rotator cuff tear or rupture of right shoulder, not specified as traumatic: Secondary | ICD-10-CM | POA: Diagnosis not present

## 2021-09-17 DIAGNOSIS — I10 Essential (primary) hypertension: Secondary | ICD-10-CM | POA: Diagnosis not present

## 2021-09-17 DIAGNOSIS — Z880 Allergy status to penicillin: Secondary | ICD-10-CM | POA: Diagnosis not present

## 2021-09-17 DIAGNOSIS — S43431A Superior glenoid labrum lesion of right shoulder, initial encounter: Secondary | ICD-10-CM | POA: Diagnosis not present

## 2021-09-17 DIAGNOSIS — S46111A Strain of muscle, fascia and tendon of long head of biceps, right arm, initial encounter: Secondary | ICD-10-CM | POA: Diagnosis not present

## 2021-09-17 DIAGNOSIS — G8918 Other acute postprocedural pain: Secondary | ICD-10-CM | POA: Diagnosis not present

## 2021-09-17 DIAGNOSIS — M75121 Complete rotator cuff tear or rupture of right shoulder, not specified as traumatic: Secondary | ICD-10-CM | POA: Diagnosis not present

## 2021-09-17 DIAGNOSIS — M7551 Bursitis of right shoulder: Secondary | ICD-10-CM | POA: Diagnosis not present

## 2021-09-26 ENCOUNTER — Other Ambulatory Visit: Payer: Self-pay | Admitting: Family Medicine

## 2021-09-26 DIAGNOSIS — M25511 Pain in right shoulder: Secondary | ICD-10-CM

## 2021-09-26 NOTE — Telephone Encounter (Signed)
Requested Prescriptions  ?Pending Prescriptions Disp Refills  ?? meloxicam (MOBIC) 15 MG tablet [Pharmacy Med Name: MELOXICAM 15MG TABLETS] 90 tablet 0  ?  Sig: TAKE 1 TABLET(15 MG) BY MOUTH DAILY  ?  ? Analgesics:  COX2 Inhibitors Failed - 09/26/2021  3:15 AM  ?  ?  Failed - Manual Review: Labs are only required if the patient has taken medication for more than 8 weeks.  ?  ?  Passed - HGB in normal range and within 360 days  ?  Hemoglobin  ?Date Value Ref Range Status  ?07/01/2021 12.6 11.1 - 15.9 g/dL Final  ?   ?  ?  Passed - Cr in normal range and within 360 days  ?  Creatinine, Ser  ?Date Value Ref Range Status  ?07/01/2021 0.84 0.57 - 1.00 mg/dL Final  ?   ?  ?  Passed - HCT in normal range and within 360 days  ?  Hematocrit  ?Date Value Ref Range Status  ?07/01/2021 38.8 34.0 - 46.6 % Final  ?   ?  ?  Passed - AST in normal range and within 360 days  ?  AST  ?Date Value Ref Range Status  ?07/01/2021 19 0 - 40 IU/L Final  ?   ?  ?  Passed - ALT in normal range and within 360 days  ?  ALT  ?Date Value Ref Range Status  ?07/01/2021 22 0 - 32 IU/L Final  ?   ?  ?  Passed - eGFR is 30 or above and within 360 days  ?  GFR calc Af Amer  ?Date Value Ref Range Status  ?06/25/2020 102 >59 mL/min/1.73 Final  ?  Comment:  ?  **In accordance with recommendations from the NKF-ASN Task force,** ?  [facility] is in the process of updating its eGFR calculation to the ?  2021 CKD-EPI creatinine equation that estimates kidney function ?  without a race variable. ?  ? ?GFR calc non Af Amer  ?Date Value Ref Range Status  ?06/25/2020 89 >59 mL/min/1.73 Final  ? ?eGFR  ?Date Value Ref Range Status  ?07/01/2021 78 >59 mL/min/1.73 Final  ?   ?  ?  Passed - Patient is not pregnant  ?  ?  Passed - Valid encounter within last 12 months  ?  Recent Outpatient Visits   ?      ? 2 months ago Type 2 diabetes mellitus without complication, unspecified whether long term insulin use ([place])  ? [facility] Jerrol Banana., MD  ?  7 months ago Annual physical exam  ? [facility] Jerrol Banana., MD  ? 11 months ago Type 2 diabetes mellitus without complication, unspecified whether long term insulin use ([place])  ? [facility] Jerrol Banana., MD  ? 1 year ago Type 2 diabetes mellitus without complication, unspecified whether long term insulin use ([place])  ? [facility] Jerrol Banana., MD  ? 1 year ago Annual physical exam  ? [facility] Jerrol Banana., MD  ?  ?  ?Future Appointments   ?        ? In 1 month Jerrol Banana., MD [facility]  ?  ? ?  ?  ?  ? ?

## 2021-10-01 DIAGNOSIS — Z9889 Other specified postprocedural states: Secondary | ICD-10-CM | POA: Diagnosis not present

## 2021-10-04 DIAGNOSIS — Z9889 Other specified postprocedural states: Secondary | ICD-10-CM | POA: Diagnosis not present

## 2021-10-08 DIAGNOSIS — Z9889 Other specified postprocedural states: Secondary | ICD-10-CM | POA: Diagnosis not present

## 2021-10-11 DIAGNOSIS — Z9889 Other specified postprocedural states: Secondary | ICD-10-CM | POA: Diagnosis not present

## 2021-10-16 DIAGNOSIS — Z9889 Other specified postprocedural states: Secondary | ICD-10-CM | POA: Diagnosis not present

## 2021-10-18 DIAGNOSIS — Z9889 Other specified postprocedural states: Secondary | ICD-10-CM | POA: Diagnosis not present

## 2021-10-24 DIAGNOSIS — Z9889 Other specified postprocedural states: Secondary | ICD-10-CM | POA: Diagnosis not present

## 2021-10-28 DIAGNOSIS — Z9889 Other specified postprocedural states: Secondary | ICD-10-CM | POA: Diagnosis not present

## 2021-10-31 DIAGNOSIS — Z9889 Other specified postprocedural states: Secondary | ICD-10-CM | POA: Diagnosis not present

## 2021-11-01 ENCOUNTER — Other Ambulatory Visit: Payer: Self-pay | Admitting: Family Medicine

## 2021-11-01 DIAGNOSIS — F320 Major depressive disorder, single episode, mild: Secondary | ICD-10-CM

## 2021-11-01 NOTE — Telephone Encounter (Signed)
Requested Prescriptions  Pending Prescriptions Disp Refills  . buPROPion (WELLBUTRIN XL) 150 MG 24 hr tablet [Pharmacy Med Name: BUPROPION XL '150MG'$  TABLETS (24 H)] 30 tablet 3    Sig: TAKE 1 TABLET(150 MG) BY MOUTH DAILY     Psychiatry: Antidepressants - bupropion Passed - 11/01/2021  3:13 AM      Passed - Cr in normal range and within 360 days    Creatinine, Ser  Date Value Ref Range Status  07/01/2021 0.84 0.57 - 1.00 mg/dL Final         Passed - AST in normal range and within 360 days    AST  Date Value Ref Range Status  07/01/2021 19 0 - 40 IU/L Final         Passed - ALT in normal range and within 360 days    ALT  Date Value Ref Range Status  07/01/2021 22 0 - 32 IU/L Final         Passed - Completed PHQ-2 or PHQ-9 in the last 360 days      Passed - Last BP in normal range    BP Readings from Last 1 Encounters:  07/01/21 123/81         Passed - Valid encounter within last 6 months    Recent Outpatient Visits          4 months ago Type 2 diabetes mellitus without complication, unspecified whether long term insulin use [facility])   [facility] Jerrol Banana., MD   8 months ago Annual physical exam   [facility] Jerrol Banana., MD   1 year ago Type 2 diabetes mellitus without complication, unspecified whether long term insulin use [facility])   [facility] Jerrol Banana., MD   1 year ago Type 2 diabetes mellitus without complication, unspecified whether long term insulin use [facility])   [facility] Jerrol Banana., MD   1 year ago Annual physical exam   [facility] Jerrol Banana., MD      Future Appointments            In 4 days Jerrol Banana., MD [facility] [place]

## 2021-11-04 ENCOUNTER — Ambulatory Visit: Payer: BC Managed Care – PPO | Admitting: Family Medicine

## 2021-11-04 DIAGNOSIS — Z9889 Other specified postprocedural states: Secondary | ICD-10-CM | POA: Diagnosis not present

## 2021-11-05 ENCOUNTER — Ambulatory Visit (INDEPENDENT_AMBULATORY_CARE_PROVIDER_SITE_OTHER): Payer: BC Managed Care – PPO | Admitting: Family Medicine

## 2021-11-05 ENCOUNTER — Encounter: Payer: Self-pay | Admitting: Family Medicine

## 2021-11-05 VITALS — BP 141/85 | HR 89 | Resp 16 | Wt 147.0 lb

## 2021-11-05 DIAGNOSIS — I10 Essential (primary) hypertension: Secondary | ICD-10-CM

## 2021-11-05 DIAGNOSIS — E119 Type 2 diabetes mellitus without complications: Secondary | ICD-10-CM | POA: Diagnosis not present

## 2021-11-05 DIAGNOSIS — F320 Major depressive disorder, single episode, mild: Secondary | ICD-10-CM

## 2021-11-05 DIAGNOSIS — K219 Gastro-esophageal reflux disease without esophagitis: Secondary | ICD-10-CM

## 2021-11-05 LAB — POCT GLYCOSYLATED HEMOGLOBIN (HGB A1C)
Est. average glucose Bld gHb Est-mCnc: 151
Hemoglobin A1C: 6.9 % — AB (ref 4.0–5.6)

## 2021-11-05 NOTE — Progress Notes (Signed)
Established patient visit  I,[name],acting as a scribe for [name], MD.,have documented all relevant documentation on the behalf of [name], MD,as directed by  [name], MD while in the presence of [name], MD.   Patient: Andrea Soto   DOB: 04-07-1961   61 y.o. Female  MRN: [id number] Visit Date: 11/05/2021  Today's healthcare provider: [name], MD   Chief Complaint  Patient presents with   Follow-up   Diabetes   Hypertension   Depression   Subjective    HPI  Patient feels well and has no complaints.  She is enjoying retirement. Emotionally she feels well and she states she is doing well on her sertraline and Wellbutrin with her emotional stressors  Diabetes Mellitus Type II, follow-up  Lab Results  Component Value Date   HGBA1C 6.9 (A) 11/05/2021   HGBA1C 6.8 (A) 07/01/2021   HGBA1C 6.9 (A) 10/17/2020   Last seen for diabetes 4 months ago.  Management since then includes continuing the same treatment.  Home blood sugar records: fasting range: not checking Most Recent Eye Exam: 08/31/2020  --------------------------------------------------------------------------------------------------- Hypertension, follow-up  BP Readings from Last 3 Encounters:  11/05/21 (!) 141/85  07/01/21 123/81  02/27/21 124/82   Wt Readings from Last 3 Encounters:  11/05/21 147 lb (66.7 kg)  07/01/21 146 lb 1.6 oz (66.3 kg)  02/27/21 147 lb (66.7 kg)     She was last seen for hypertension 4 months ago.  Management since that visit includes on losartan 50 mg.  Outside blood pressures are 120/60.  --------------------------------------------------------------------------------------------------- Depression, Follow-up  She  was last seen for this 4 months ago. Changes made at last visit include; Tried Wellbutrin but may need referral for counseling.  Patient is willing to do this.      07/01/2021    9:33 AM 02/27/2021    10:04 AM 02/22/2020    9:34 AM  Depression screen PHQ 2/9  Decreased Interest 0 0 0  Down, Depressed, Hopeless 0 0 0  PHQ - 2 Score 0 0 0  Altered sleeping 0 1 0  Tired, decreased energy 0 0 0  Change in appetite 0 0 0  Feeling bad or failure about yourself  0 1 0  Trouble concentrating 2 0 1  Moving slowly or fidgety/restless 0 0 0  Suicidal thoughts 0 0 0  PHQ-9 Score '2 2 1  '$ Difficult doing work/chores Not difficult at all Not difficult at all Not difficult at all    -----------------------------------------------------------------------------------------    Medications: Outpatient Medications Prior to Visit  Medication Sig   aspirin 81 MG tablet Take by mouth.   atorvastatin (LIPITOR) 40 MG tablet TAKE 1 TABLET(40 MG) BY MOUTH DAILY   buPROPion (WELLBUTRIN XL) 150 MG 24 hr tablet TAKE 1 TABLET(150 MG) BY MOUTH DAILY   Coenzyme Q10 (COQ10) 200 MG CAPS Take by mouth.   Cyanocobalamin (VITAMIN B-12 PO) Take by mouth.   losartan (COZAAR) 50 MG tablet TAKE 1 TABLET(50 MG) BY MOUTH DAILY   meloxicam (MOBIC) 15 MG tablet TAKE 1 TABLET(15 MG) BY MOUTH DAILY   metFORMIN (GLUCOPHAGE) 1000 MG tablet TAKE 1 TABLET BY MOUTH TWO  TIMES DAILY WITH A MEAL   sertraline (ZOLOFT) 100 MG tablet TAKE 1 TABLET(100 MG) BY MOUTH DAILY   [DISCONTINUED] nitrofurantoin, macrocrystal-monohydrate, (MACROBID) 100 MG capsule Take 1 capsule (100 mg total) by mouth 2 (two) times daily. (Patient not taking: Reported on 11/05/2021)   No  facility-administered medications prior to visit.    Review of Systems  Constitutional:  Negative for appetite change, chills, fatigue and fever.  Respiratory:  Negative for chest tightness and shortness of breath.   Cardiovascular:  Negative for chest pain and palpitations.  Gastrointestinal:  Negative for abdominal pain, nausea and vomiting.  Neurological:  Negative for dizziness and weakness.    Last hemoglobin A1c Lab Results  Component Value Date   HGBA1C 6.9  (A) 11/05/2021       Objective    BP (!) 141/85 (BP Location: Left Arm, Patient Position: Sitting, Cuff Size: Normal)   Pulse 89   Resp 16   Wt 147 lb (66.7 kg)   SpO2 98%   BMI 26.89 kg/m  BP Readings from Last 3 Encounters:  11/05/21 (!) 141/85  07/01/21 123/81  02/27/21 124/82   Wt Readings from Last 3 Encounters:  11/05/21 147 lb (66.7 kg)  07/01/21 146 lb 1.6 oz (66.3 kg)  02/27/21 147 lb (66.7 kg)      Physical Exam Vitals reviewed.  Constitutional:      Appearance: Normal appearance.  HENT:     Head: Normocephalic and atraumatic.  Eyes:     General: No scleral icterus.    Conjunctiva/sclera: Conjunctivae normal.  Neck:     Vascular: No carotid bruit.  Cardiovascular:     Rate and Rhythm: Normal rate and regular rhythm.     Pulses: Normal pulses.     Heart sounds: Normal heart sounds.  Pulmonary:     Effort: Pulmonary effort is normal.     Breath sounds: Normal breath sounds.  Abdominal:     Palpations: Abdomen is soft.  Musculoskeletal:     Cervical back: Normal range of motion.     Right lower leg: No edema.     Left lower leg: No edema.  Skin:    General: Skin is warm and dry.  Neurological:     General: No focal deficit present.     Mental Status: She is alert and oriented to person, place, and time.  Psychiatric:        Mood and Affect: Mood normal.        Behavior: Behavior normal.        Thought Content: Thought content normal.        Judgment: Judgment normal.       Results for orders placed or performed in visit on 11/05/21  POCT glycosylated hemoglobin (Hb A1C)  Result Value Ref Range   Hemoglobin A1C 6.9 (A) 4.0 - 5.6 %   Est. average glucose Bld gHb Est-mCnc 151     Assessment & Plan      1. Type 2 diabetes mellitus without complication, unspecified whether long term insulin use (HCC) A1c stable at 6.9.  No hypoglycemia.  Continue present medication. - POCT glycosylated hemoglobin (Hb A1C)  2. Essential hypertension Blood  pressure control.  Check home blood pressures as systolic is mildly elevated 141 today. She will bring in home blood pressure  3. Depression, major, single episode, mild (HCC) Well-controlled on Wellbutrin and sertraline  4. Gastroesophageal reflux disease without esophagitis   5. HLD On high-dose atorvastatin 40    No follow-ups on file.      I, [name], MD, have reviewed all documentation for this visit. The documentation on 11/10/21 for the exam, diagnosis, procedures, and orders are all accurate and complete.    [name], MD  [facility] [phone] (phone) [phone] (fax)  [place]

## 2021-11-12 DIAGNOSIS — Z9889 Other specified postprocedural states: Secondary | ICD-10-CM | POA: Diagnosis not present

## 2021-11-14 DIAGNOSIS — Z9889 Other specified postprocedural states: Secondary | ICD-10-CM | POA: Diagnosis not present

## 2021-11-18 DIAGNOSIS — Z9889 Other specified postprocedural states: Secondary | ICD-10-CM | POA: Diagnosis not present

## 2021-11-21 DIAGNOSIS — Z9889 Other specified postprocedural states: Secondary | ICD-10-CM | POA: Diagnosis not present

## 2021-11-27 DIAGNOSIS — Z9889 Other specified postprocedural states: Secondary | ICD-10-CM | POA: Diagnosis not present

## 2021-11-29 DIAGNOSIS — Z9889 Other specified postprocedural states: Secondary | ICD-10-CM | POA: Diagnosis not present

## 2021-12-03 DIAGNOSIS — Z9889 Other specified postprocedural states: Secondary | ICD-10-CM | POA: Diagnosis not present

## 2021-12-05 DIAGNOSIS — Z9889 Other specified postprocedural states: Secondary | ICD-10-CM | POA: Diagnosis not present

## 2021-12-09 DIAGNOSIS — Z9889 Other specified postprocedural states: Secondary | ICD-10-CM | POA: Diagnosis not present

## 2021-12-12 DIAGNOSIS — Z9889 Other specified postprocedural states: Secondary | ICD-10-CM | POA: Diagnosis not present

## 2021-12-19 DIAGNOSIS — Z9889 Other specified postprocedural states: Secondary | ICD-10-CM | POA: Diagnosis not present

## 2021-12-30 DIAGNOSIS — Z9889 Other specified postprocedural states: Secondary | ICD-10-CM | POA: Diagnosis not present

## 2021-12-31 ENCOUNTER — Other Ambulatory Visit: Payer: Self-pay | Admitting: Family Medicine

## 2021-12-31 DIAGNOSIS — E7800 Pure hypercholesterolemia, unspecified: Secondary | ICD-10-CM

## 2021-12-31 DIAGNOSIS — M25511 Pain in right shoulder: Secondary | ICD-10-CM

## 2021-12-31 DIAGNOSIS — E119 Type 2 diabetes mellitus without complications: Secondary | ICD-10-CM

## 2022-01-03 DIAGNOSIS — M75101 Unspecified rotator cuff tear or rupture of right shoulder, not specified as traumatic: Secondary | ICD-10-CM | POA: Diagnosis not present

## 2022-01-06 DIAGNOSIS — Z9889 Other specified postprocedural states: Secondary | ICD-10-CM | POA: Diagnosis not present

## 2022-01-10 ENCOUNTER — Other Ambulatory Visit: Payer: Self-pay | Admitting: Family Medicine

## 2022-01-10 DIAGNOSIS — F320 Major depressive disorder, single episode, mild: Secondary | ICD-10-CM

## 2022-01-20 DIAGNOSIS — Z9889 Other specified postprocedural states: Secondary | ICD-10-CM | POA: Diagnosis not present

## 2022-02-20 ENCOUNTER — Other Ambulatory Visit: Payer: Self-pay | Admitting: Family Medicine

## 2022-02-20 DIAGNOSIS — F320 Major depressive disorder, single episode, mild: Secondary | ICD-10-CM

## 2022-03-13 ENCOUNTER — Encounter: Payer: Self-pay | Admitting: Physician Assistant

## 2022-03-13 ENCOUNTER — Ambulatory Visit: Payer: BC Managed Care – PPO | Admitting: Family Medicine

## 2022-03-13 ENCOUNTER — Ambulatory Visit (INDEPENDENT_AMBULATORY_CARE_PROVIDER_SITE_OTHER): Payer: BC Managed Care – PPO | Admitting: Physician Assistant

## 2022-03-13 VITALS — BP 126/77 | HR 86 | Wt 143.5 lb

## 2022-03-13 DIAGNOSIS — E1159 Type 2 diabetes mellitus with other circulatory complications: Secondary | ICD-10-CM

## 2022-03-13 DIAGNOSIS — I152 Hypertension secondary to endocrine disorders: Secondary | ICD-10-CM

## 2022-03-13 DIAGNOSIS — Z23 Encounter for immunization: Secondary | ICD-10-CM | POA: Diagnosis not present

## 2022-03-13 DIAGNOSIS — E119 Type 2 diabetes mellitus without complications: Secondary | ICD-10-CM

## 2022-03-13 DIAGNOSIS — Z1231 Encounter for screening mammogram for malignant neoplasm of breast: Secondary | ICD-10-CM | POA: Diagnosis not present

## 2022-03-13 LAB — POCT GLYCOSYLATED HEMOGLOBIN (HGB A1C): Hemoglobin A1C: 6.5 % — AB (ref 4.0–5.6)

## 2022-03-13 NOTE — Assessment & Plan Note (Addendum)
Last a1c 6.9%, relatively stable ; today 6.5% Managed with just metformin 1000 mg BID On statin LDL < 100 but not at goal < 70 On acei/arb uacr ordered today  Foot exam completed today optho pt has appt F/u 6 mo

## 2022-03-13 NOTE — Assessment & Plan Note (Signed)
Chronic; managed with losartan 50 mg  Well controlled continue current medications Reviewed last cmp F/u 6 mo

## 2022-03-13 NOTE — Progress Notes (Signed)
   I,Sha'taria Tyson,acting as a scribe for Lindsay Drubel, PA-C.,have documented all relevant documentation on the behalf of Lindsay Drubel, PA-C,as directed by  Lindsay Drubel, PA-C while in the presence of Lindsay Drubel, PA-C.   Established patient visit   Patient: Andrea Soto   DOB: 08/13/1960   61 y.o. Female  MRN: [id number] Visit Date: 03/13/2022  Today's healthcare provider: Lindsay Drubel, PA-C   Cc. Htn, dm II   Subjective    HPI  Hypertension, follow-up  BP Readings from Last 3 Encounters:  03/13/22 126/77  11/05/21 (!) 141/85  07/01/21 123/81   Wt Readings from Last 3 Encounters:  03/13/22 143 lb 8 oz (65.1 kg)  11/05/21 147 lb (66.7 kg)  07/01/21 146 lb 1.6 oz (66.3 kg)     She was last seen for hypertension 4 months ago.  BP at that visit was 141/85. Management since that visit includes continue current treatment and bring home reading next visit.  She reports excellent compliance with treatment. She is not having side effects.  She is following a Low Sodium diet. She is exercising. She does not smoke.  Use of agents associated with hypertension: none.   Outside blood pressures are checked on occassions Symptoms: No chest pain No chest pressure  No palpitations No syncope  No dyspnea No orthopnea  No paroxysmal nocturnal dyspnea No lower extremity edema   Pertinent labs Lab Results  Component Value Date   CHOL 161 07/01/2021   HDL 39 (L) 07/01/2021   LDLCALC 83 07/01/2021   TRIG 232 (H) 07/01/2021   CHOLHDL 4.1 07/01/2021   Lab Results  Component Value Date   NA 139 07/01/2021   K 4.5 07/01/2021   CREATININE 0.84 07/01/2021   EGFR 78 07/01/2021   GLUCOSE 106 (H) 07/01/2021   TSH 1.780 07/01/2021     The 10-year ASCVD risk score (Arnett DK, et al., 2019) is: 11.5%  ---------------------------------------------------------------------------------------------------  Diabetes Mellitus Type II, Follow-up  Lab Results  Component Value  Date   HGBA1C 6.5 (A) 03/13/2022   HGBA1C 6.9 (A) 11/05/2021   HGBA1C 6.8 (A) 07/01/2021   Wt Readings from Last 3 Encounters:  03/13/22 143 lb 8 oz (65.1 kg)  11/05/21 147 lb (66.7 kg)  07/01/21 146 lb 1.6 oz (66.3 kg)   Last seen for diabetes 4 months ago.  Management since then includes continue current treatment. She reports excellent compliance with treatment. She is not having side effects.  Symptoms: No fatigue No foot ulcerations  No appetite changes No nausea  No paresthesia of the feet  No polydipsia  No polyuria No visual disturbances   No vomiting     Home blood sugar records:  not being checked  Episodes of hypoglycemia? Yes faint, light headed -- but not checking BS   Current insulin regiment: none Most Recent Eye Exam: Scheduled for November 23 Current exercise: walking Current diet habits: low salt  Pertinent Labs: Lab Results  Component Value Date   CHOL 161 07/01/2021   HDL 39 (L) 07/01/2021   LDLCALC 83 07/01/2021   TRIG 232 (H) 07/01/2021   CHOLHDL 4.1 07/01/2021   Lab Results  Component Value Date   NA 139 07/01/2021   K 4.5 07/01/2021   CREATININE 0.84 07/01/2021   EGFR 78 07/01/2021   MICROALBUR 50 02/22/2020     ---------------------------------------------------------------------------------------------------   Medications: Outpatient Medications Prior to Visit  Medication Sig   aspirin 81 MG tablet Take by mouth.   atorvastatin (LIPITOR) 40   MG tablet TAKE 1 TABLET(40 MG) BY MOUTH DAILY   buPROPion (WELLBUTRIN XL) 150 MG 24 hr tablet TAKE 1 TABLET(150 MG) BY MOUTH DAILY   Coenzyme Q10 (COQ10) 200 MG CAPS Take by mouth.   Cyanocobalamin (VITAMIN B-12 PO) Take by mouth.   losartan (COZAAR) 50 MG tablet TAKE 1 TABLET(50 MG) BY MOUTH DAILY   meloxicam (MOBIC) 15 MG tablet TAKE 1 TABLET(15 MG) BY MOUTH DAILY   metFORMIN (GLUCOPHAGE) 1000 MG tablet TAKE 1 TABLET BY MOUTH TWICE DAILY WITH A MEAL   sertraline (ZOLOFT) 100 MG tablet TAKE 1  TABLET(100 MG) BY MOUTH DAILY   No facility-administered medications prior to visit.    Review of Systems  Constitutional:  Negative for fatigue and fever.  Respiratory:  Negative for cough and shortness of breath.   Cardiovascular:  Negative for chest pain and leg swelling.  Gastrointestinal:  Negative for abdominal pain.  Neurological:  Negative for dizziness and headaches.       Objective    Blood pressure 126/77, pulse 86, weight 143 lb 8 oz (65.1 kg), SpO2 99 %.   Physical Exam Constitutional:      General: She is awake.     Appearance: She is well-developed.  HENT:     Head: Normocephalic.  Eyes:     Conjunctiva/sclera: Conjunctivae normal.  Cardiovascular:     Rate and Rhythm: Normal rate and regular rhythm.     Pulses:          Dorsalis pedis pulses are 3+ on the right side and 3+ on the left side.       Posterior tibial pulses are 3+ on the right side and 3+ on the left side.     Heart sounds: Normal heart sounds.  Pulmonary:     Effort: Pulmonary effort is normal.     Breath sounds: Normal breath sounds.  Feet:     Right foot:     Protective Sensation: 4 sites tested.  4 sites sensed.     Skin integrity: Skin integrity normal.     Toenail Condition: Right toenails are normal.     Left foot:     Protective Sensation: 4 sites tested.  4 sites sensed.     Skin integrity: Skin integrity normal.     Toenail Condition: Left toenails are normal.  Skin:    General: Skin is warm.  Neurological:     Mental Status: She is alert and oriented to person, place, and time.  Psychiatric:        Attention and Perception: Attention normal.        Mood and Affect: Mood normal.        Speech: Speech normal.        Behavior: Behavior is cooperative.     Results for orders placed or performed in visit on 03/13/22  POCT glycosylated hemoglobin (Hb A1C)  Result Value Ref Range   Hemoglobin A1C 6.5 (A) 4.0 - 5.6 %   HbA1c POC (<> result, manual entry)     HbA1c, POC  (prediabetic range)     HbA1c, POC (controlled diabetic range)      Assessment & Plan     Problem List Items Addressed This Visit       Cardiovascular and Mediastinum   Hypertension associated with diabetes ([place])    Chronic; managed with losartan 50 mg  Well controlled continue current medications Reviewed last cmp F/u 6 mo         Endocrine  Diabetes mellitus, type 2 ([place]) - Primary    Last a1c 6.9%, relatively stable ; today 6.5% Managed with just metformin 1000 mg BID On statin LDL < 100 but not at goal < 70 On acei/arb uacr ordered today  Foot exam completed today optho pt has appt F/u 6 mo      Relevant Orders   POCT glycosylated hemoglobin (Hb A1C) (Completed)   Urine Microalbumin w/creat. ratio   Other Visit Diagnoses     Need for immunization against influenza       Relevant Orders   Flu Vaccine QUAD 6+ mos PF IM (Fluarix Quad PF) (Completed)   Breast cancer screening by mammogram       Relevant Orders   MM 3D SCREEN BREAST BILATERAL      Pt will make appt with GYN for pap Pt has optho scheduled  Return in about 6 months (around 09/12/2022) for CPE.      I, Mikey Kirschner, PA-C have reviewed all documentation for this visit. The documentation on  _0 @  for the exam, diagnosis, procedures, and orders are all accurate and complete.  Mikey Kirschner, PA-C [facility] [address] [place], [place], [address] Office: [phone] Fax: [place]

## 2022-03-14 LAB — SPECIMEN STATUS REPORT

## 2022-03-14 LAB — MICROALBUMIN / CREATININE URINE RATIO
Creatinine, Urine: 171.6 mg/dL
Microalb/Creat Ratio: 7 mg/g creat (ref 0–29)
Microalbumin, Urine: 12.2 ug/mL

## 2022-03-31 DIAGNOSIS — F419 Anxiety disorder, unspecified: Secondary | ICD-10-CM | POA: Diagnosis not present

## 2022-03-31 DIAGNOSIS — Z79899 Other long term (current) drug therapy: Secondary | ICD-10-CM | POA: Diagnosis not present

## 2022-03-31 DIAGNOSIS — M1990 Unspecified osteoarthritis, unspecified site: Secondary | ICD-10-CM | POA: Diagnosis not present

## 2022-03-31 DIAGNOSIS — R072 Precordial pain: Secondary | ICD-10-CM | POA: Diagnosis not present

## 2022-03-31 DIAGNOSIS — Z7982 Long term (current) use of aspirin: Secondary | ICD-10-CM | POA: Diagnosis not present

## 2022-03-31 DIAGNOSIS — I10 Essential (primary) hypertension: Secondary | ICD-10-CM | POA: Diagnosis not present

## 2022-03-31 DIAGNOSIS — R0789 Other chest pain: Secondary | ICD-10-CM | POA: Diagnosis not present

## 2022-03-31 DIAGNOSIS — R0781 Pleurodynia: Secondary | ICD-10-CM | POA: Diagnosis not present

## 2022-03-31 DIAGNOSIS — Z7984 Long term (current) use of oral hypoglycemic drugs: Secondary | ICD-10-CM | POA: Diagnosis not present

## 2022-03-31 DIAGNOSIS — E119 Type 2 diabetes mellitus without complications: Secondary | ICD-10-CM | POA: Diagnosis not present

## 2022-03-31 DIAGNOSIS — R079 Chest pain, unspecified: Secondary | ICD-10-CM | POA: Diagnosis not present

## 2022-04-01 DIAGNOSIS — R0789 Other chest pain: Secondary | ICD-10-CM | POA: Diagnosis not present

## 2022-04-07 ENCOUNTER — Other Ambulatory Visit: Payer: Self-pay

## 2022-04-07 ENCOUNTER — Telehealth: Payer: Self-pay | Admitting: Family Medicine

## 2022-04-07 DIAGNOSIS — I10 Essential (primary) hypertension: Secondary | ICD-10-CM

## 2022-04-07 MED ORDER — LOSARTAN POTASSIUM 50 MG PO TABS: ORAL_TABLET | ORAL | 1 refills | Status: DC

## 2022-04-07 NOTE — Telephone Encounter (Signed)
[facility] faxed refill request for the following medications:    losartan (COZAAR) 50 MG tablet   Please advise

## 2022-04-14 ENCOUNTER — Telehealth: Payer: Self-pay | Admitting: Family Medicine

## 2022-04-14 DIAGNOSIS — F320 Major depressive disorder, single episode, mild: Secondary | ICD-10-CM

## 2022-04-14 MED ORDER — SERTRALINE HCL 100 MG PO TABS: ORAL_TABLET | ORAL | 0 refills | Status: AC

## 2022-04-14 NOTE — Telephone Encounter (Signed)
[facility] faxed refill request for the following medications:   sertraline (ZOLOFT) 100 MG tablet   Advance refill approval request  Please advise.

## 2022-04-22 DIAGNOSIS — H2513 Age-related nuclear cataract, bilateral: Secondary | ICD-10-CM | POA: Diagnosis not present

## 2022-04-22 DIAGNOSIS — I10 Essential (primary) hypertension: Secondary | ICD-10-CM | POA: Diagnosis not present

## 2022-04-22 LAB — HM DIABETES EYE EXAM

## 2022-04-29 DIAGNOSIS — J069 Acute upper respiratory infection, unspecified: Secondary | ICD-10-CM | POA: Diagnosis not present

## 2022-04-29 DIAGNOSIS — H6503 Acute serous otitis media, bilateral: Secondary | ICD-10-CM | POA: Diagnosis not present

## 2022-04-29 DIAGNOSIS — R059 Cough, unspecified: Secondary | ICD-10-CM | POA: Diagnosis not present

## 2022-05-02 ENCOUNTER — Ambulatory Visit: Payer: Self-pay | Admitting: *Deleted

## 2022-05-02 DIAGNOSIS — J90 Pleural effusion, not elsewhere classified: Secondary | ICD-10-CM | POA: Diagnosis not present

## 2022-05-02 DIAGNOSIS — Z79899 Other long term (current) drug therapy: Secondary | ICD-10-CM | POA: Diagnosis not present

## 2022-05-02 DIAGNOSIS — R5383 Other fatigue: Secondary | ICD-10-CM | POA: Diagnosis not present

## 2022-05-02 DIAGNOSIS — I10 Essential (primary) hypertension: Secondary | ICD-10-CM | POA: Diagnosis not present

## 2022-05-02 DIAGNOSIS — E119 Type 2 diabetes mellitus without complications: Secondary | ICD-10-CM | POA: Diagnosis not present

## 2022-05-02 DIAGNOSIS — I3139 Other pericardial effusion (noninflammatory): Secondary | ICD-10-CM | POA: Diagnosis not present

## 2022-05-02 DIAGNOSIS — J9811 Atelectasis: Secondary | ICD-10-CM | POA: Diagnosis not present

## 2022-05-02 DIAGNOSIS — I301 Infective pericarditis: Secondary | ICD-10-CM | POA: Diagnosis not present

## 2022-05-02 DIAGNOSIS — Z91048 Other nonmedicinal substance allergy status: Secondary | ICD-10-CM | POA: Diagnosis not present

## 2022-05-02 DIAGNOSIS — Z7982 Long term (current) use of aspirin: Secondary | ICD-10-CM | POA: Diagnosis not present

## 2022-05-02 DIAGNOSIS — F32A Depression, unspecified: Secondary | ICD-10-CM | POA: Diagnosis not present

## 2022-05-02 DIAGNOSIS — E7800 Pure hypercholesterolemia, unspecified: Secondary | ICD-10-CM | POA: Diagnosis not present

## 2022-05-02 DIAGNOSIS — K219 Gastro-esophageal reflux disease without esophagitis: Secondary | ICD-10-CM | POA: Diagnosis not present

## 2022-05-02 DIAGNOSIS — R079 Chest pain, unspecified: Secondary | ICD-10-CM | POA: Diagnosis not present

## 2022-05-02 DIAGNOSIS — R0789 Other chest pain: Secondary | ICD-10-CM | POA: Diagnosis not present

## 2022-05-02 DIAGNOSIS — Z880 Allergy status to penicillin: Secondary | ICD-10-CM | POA: Diagnosis not present

## 2022-05-02 DIAGNOSIS — J029 Acute pharyngitis, unspecified: Secondary | ICD-10-CM | POA: Diagnosis not present

## 2022-05-02 DIAGNOSIS — R0602 Shortness of breath: Secondary | ICD-10-CM | POA: Diagnosis not present

## 2022-05-02 DIAGNOSIS — Z1152 Encounter for screening for COVID-19: Secondary | ICD-10-CM | POA: Diagnosis not present

## 2022-05-02 NOTE — Telephone Encounter (Signed)
Reason for Disposition  [1] Taking antibiotic > 48 hours (2 days) AND [2] fever persists  Answer Assessment - Initial Assessment Questions 1. ANTIBIOTIC: "What antibiotic are you taking?" "How many times a day?"     Doxycycline 100 mg bid 2. ONSET: "When was the antibiotic started?"     12/5 3. PAIN: "How bad is the sinus pain?"   (Scale 1-10; mild, moderate or severe)   - MILD (1-3): doesn't interfere with normal activities    - MODERATE (4-7): interferes with normal activities (e.g., work or school) or awakens from sleep   - SEVERE (8-10): excruciating pain and patient unable to do any normal activities        Ear pain, pain with deep breath 4. FEVER: "Do you have a fever?" If Yes, ask: "What is it, how was it measured, and when did it start?"      Low grade- 99-100- 3 days 5. SYMPTOMS: "Are there any other symptoms you're concerned about?" If Yes, ask: "When did it start?"     Ear pain and pain with breathing not better  Protocols used: Sinus Infection on Antibiotic Follow-up Call-A-AH

## 2022-05-02 NOTE — Telephone Encounter (Signed)
  Chief Complaint: patient on antibiotic- URI- not better Symptoms: ear pain, pain with deep breath, low grade temp Frequency: was seen Tuesday at [facility] Pertinent Negatives: Patient denies   Disposition: '[]'$ ED /'[x]'$ Urgent Care (no appt availability in office) / '[]'$ Appointment(In office/virtual)/ '[]'$  [facility]/ '[]'$ Home Care/ '[]'$ Refused Recommended Disposition /'[]'$ [facility]/ '[]'$  Follow-up with PCP Additional Notes: no open appointment- advised UC

## 2022-05-02 NOTE — Telephone Encounter (Signed)
Agree pt was not seen by [place] but by urgent care originally

## 2022-05-05 ENCOUNTER — Telehealth: Payer: Self-pay

## 2022-05-05 DIAGNOSIS — R0789 Other chest pain: Secondary | ICD-10-CM | POA: Diagnosis not present

## 2022-05-05 NOTE — Telephone Encounter (Signed)
Transition Care Management Follow-up Telephone Call Date of discharge and from where: 05/02/22 [facility] How have you been since you were released from the hospital? Feeling better, doesn't hurt to breathe Any questions or concerns? No  Items Reviewed: Did the pt receive and understand the discharge instructions provided? Yes  Medications obtained and verified? Yes  Other? No  Any new allergies since your discharge? No  Dietary orders reviewed? No Do you have support at home? Yes     Functional Questionnaire: (I = Independent and D = Dependent) ADLs: I  Bathing/Dressing- I  Meal Prep- I  Eating- I  Maintaining continence- I  Transferring/Ambulation- I  Managing Meds- I  Follow up appointments reviewed:  PCP Hospital f/u appt confirmed? Yes  Scheduled to see Dr.Robinson on 12/12 @ 2:20. Are transportation arrangements needed? No  If their condition worsens, is the pt aware to call PCP or go to the Emergency Dept.? Yes Was the patient provided with contact information for the PCP's office or ED? Yes Was to pt encouraged to call back with questions or concerns? Yes

## 2022-05-06 ENCOUNTER — Encounter: Payer: Self-pay | Admitting: Family Medicine

## 2022-05-06 ENCOUNTER — Ambulatory Visit (INDEPENDENT_AMBULATORY_CARE_PROVIDER_SITE_OTHER): Payer: BC Managed Care – PPO | Admitting: Family Medicine

## 2022-05-06 VITALS — BP 126/82 | HR 76 | Temp 97.8°F | Resp 16 | Ht 61.0 in | Wt 145.3 lb

## 2022-05-06 DIAGNOSIS — R7401 Elevation of levels of liver transaminase levels: Secondary | ICD-10-CM | POA: Insufficient documentation

## 2022-05-06 DIAGNOSIS — R110 Nausea: Secondary | ICD-10-CM

## 2022-05-06 DIAGNOSIS — Z114 Encounter for screening for human immunodeficiency virus [HIV]: Secondary | ICD-10-CM | POA: Insufficient documentation

## 2022-05-06 DIAGNOSIS — R748 Abnormal levels of other serum enzymes: Secondary | ICD-10-CM | POA: Insufficient documentation

## 2022-05-06 NOTE — Progress Notes (Unsigned)
     I,Joseline E Rosas,acting as a scribe for [employer], MD.,have documented all relevant documentation on the behalf of Eulis Foster, MD,as directed by  Eulis Foster, MD while in the presence of Eulis Foster, MD.   Established patient visit   Patient: Andrea Soto   DOB: [birth year]   61 y.o. Female  MRN: [id number] Visit Date: 05/06/2022  Today's healthcare provider: Eulis Foster, MD   Chief Complaint  Patient presents with   Follow-up   Subjective    HPI  Follow up ER visit  Patient was seen in ER for Acute viral pericarditis  on 05/02/2022. Treatment for this included placed on Naprosyn 500 mg twice daily for the next 10 days . She reports excellent compliance with treatment. She reports this condition is Improved. Still having sweats at night off and on and reports that still having nauseas after eating. She reports that she does not have nausea with drinking fluids  She has been able to keep foods down and the nausea subsides after eating  She has not noticed the nausea triggered by specific foods  She denies chest pain or dysphagia  She denies further SOB  She states that if she lies on her left side,she sometimes has some chest discomfort that resolves soon after resting for a few minutes    Medications: Outpatient Medications Prior to Visit  Medication Sig   aspirin 81 MG tablet Take by mouth.   atorvastatin (LIPITOR) 40 MG tablet TAKE 1 TABLET(40 MG) BY MOUTH DAILY   buPROPion (WELLBUTRIN XL) 150 MG 24 hr tablet TAKE 1 TABLET(150 MG) BY MOUTH DAILY   Coenzyme Q10 (COQ10) 200 MG CAPS Take by mouth.   Cyanocobalamin (VITAMIN B-12 PO) Take by mouth.   losartan (COZAAR) 50 MG tablet TAKE 1 TABLET(50 MG) BY MOUTH DAILY   metFORMIN (GLUCOPHAGE) 1000 MG tablet TAKE 1 TABLET BY MOUTH TWICE DAILY WITH A MEAL   sertraline (ZOLOFT) 100 MG tablet TAKE 1 TABLET(100 MG) BY MOUTH DAILY   meloxicam (MOBIC) 15 MG tablet  TAKE 1 TABLET(15 MG) BY MOUTH DAILY   No facility-administered medications prior to visit.    Review of Systems  {Labs  Heme  Chem  Endocrine  Serology  Results Review (optional):23779}   Objective    BP 126/82 (BP Location: Left Arm, Patient Position: Sitting, Cuff Size: Normal)   Pulse 76   Temp 97.8 F (36.6 C) (Oral)   Resp 16   Ht '5\' 1"'$  (1.549 m)   Wt 145 lb 4.8 oz (65.9 kg)   BMI 27.45 kg/m  {Show previous vital signs (optional):23777}  Physical Exam  ***  No results found for any visits on 05/06/22.  Assessment & Plan     ***  No follow-ups on file.      {provider attestation***:1}   Eulis Foster, MD  [facility] [phone] (phone) [phone] (fax)  [place]

## 2022-05-07 LAB — COMPREHENSIVE METABOLIC PANEL
ALT: 30 IU/L (ref 0–32)
AST: 22 IU/L (ref 0–40)
Albumin/Globulin Ratio: 1.1 — ABNORMAL LOW (ref 1.2–2.2)
Albumin: 3.8 g/dL — ABNORMAL LOW (ref 3.9–4.9)
Alkaline Phosphatase: 140 IU/L — ABNORMAL HIGH (ref 44–121)
BUN/Creatinine Ratio: 18 (ref 12–28)
BUN: 13 mg/dL (ref 8–27)
Bilirubin Total: <0.2 mg/dL (ref 0.0–1.2)
CO2: 23 mmol/L (ref 20–29)
Calcium: 9.1 mg/dL (ref 8.7–10.3)
Chloride: 100 mmol/L (ref 96–106)
Creatinine, Ser: 0.73 mg/dL (ref 0.57–1.00)
Globulin, Total: 3.4 g/dL (ref 1.5–4.5)
Glucose: 140 mg/dL — ABNORMAL HIGH (ref 70–99)
Potassium: 4.6 mmol/L (ref 3.5–5.2)
Sodium: 140 mmol/L (ref 134–144)
Total Protein: 7.2 g/dL (ref 6.0–8.5)
eGFR: 92 mL/min/{1.73_m2} (ref >59)

## 2022-05-07 LAB — HIV ANTIBODY (ROUTINE TESTING W REFLEX): HIV Screen 4th Generation wRfx: NONREACTIVE

## 2022-05-07 LAB — ACUTE VIRAL HEPATITIS (HAV, HBV, HCV)
HCV Ab: NONREACTIVE
Hep A IgM: NEGATIVE
Hep B C IgM: NEGATIVE
Hepatitis B Surface Ag: NEGATIVE

## 2022-05-07 LAB — GAMMA GT: GGT: 49 IU/L (ref 0–60)

## 2022-05-07 LAB — HCV INTERPRETATION

## 2022-05-07 NOTE — Assessment & Plan Note (Signed)
Repeat CMP  GGT ordered  Hepatitis panel ordered

## 2022-05-07 NOTE — Assessment & Plan Note (Signed)
Repeat CMP

## 2022-05-07 NOTE — Assessment & Plan Note (Signed)
Suspect that this may be related to recent illness  In setting of slightly elevated transaminases, considering hepatitis, will order acute hep panel as well as repeat cmp for elevated alk phos  Last colonoscopy in 2020 with recommendation for 5 year repeat for adenomatous polyp

## 2022-05-07 NOTE — Assessment & Plan Note (Signed)
HIV screening ordered today

## 2022-06-05 NOTE — Progress Notes (Signed)
I,Joseline E Rosas,acting as a scribe for [employer], MD.,have documented all relevant documentation on the behalf of Eulis Foster, MD,as directed by  Eulis Foster, MD while in the presence of Eulis Foster, MD.   Established patient visit   Patient: Andrea Soto   DOB: 1960-10-25   62 y.o. Female  MRN: [id number] Visit Date: 06/06/2022  Today's healthcare provider: Eulis Foster, MD   Chief Complaint  Patient presents with   Follow-up   Subjective    HPI  Follow up for Nausea  The patient was last seen for this 1 months ago. Alk Phos was elevated last labs. Patient reports today no more nausea or sweats   Elevated Alk Phos  She denies RUQ abdominal pain  Denies bone pain/aches  She reports normal bowel movements  She states that her energy levels are closer to normal than previous, states she has been taking iron pills   Health Care Update Patient is requesting to have prescriptions changed to mail order pharmacy due to cost efficiency She has blue cross, blue shield insurance  Recommended that she call pharmacy number listed on insurance care so that she will know the exact pharmacy to change prescriptions to   Medications: Outpatient Medications Prior to Visit  Medication Sig   aspirin 81 MG tablet Take by mouth.   atorvastatin (LIPITOR) 40 MG tablet TAKE 1 TABLET(40 MG) BY MOUTH DAILY   buPROPion (WELLBUTRIN XL) 150 MG 24 hr tablet TAKE 1 TABLET(150 MG) BY MOUTH DAILY   Coenzyme Q10 (COQ10) 200 MG CAPS Take by mouth.   Cyanocobalamin (VITAMIN B-12 PO) Take by mouth.   losartan (COZAAR) 50 MG tablet TAKE 1 TABLET(50 MG) BY MOUTH DAILY   metFORMIN (GLUCOPHAGE) 1000 MG tablet TAKE 1 TABLET BY MOUTH TWICE DAILY WITH A MEAL   sertraline (ZOLOFT) 100 MG tablet TAKE 1 TABLET(100 MG) BY MOUTH DAILY   meloxicam (MOBIC) 15 MG tablet TAKE 1 TABLET(15 MG) BY MOUTH DAILY   No facility-administered medications prior  to visit.    Review of Systems     Objective    BP 123/81 (BP Location: Left Arm, Patient Position: Sitting, Cuff Size: Normal)   Pulse 84   Temp (!) 97.5 F (36.4 C) (Oral)   Resp 16   Wt 140 lb 14.4 oz (63.9 kg)   BMI 26.62 kg/m    Physical Exam Vitals reviewed.  Constitutional:      General: She is not in acute distress.    Appearance: Normal appearance. She is not ill-appearing, toxic-appearing or diaphoretic.  Eyes:     Conjunctiva/sclera: Conjunctivae normal.  Cardiovascular:     Rate and Rhythm: Normal rate and regular rhythm.     Pulses: Normal pulses.     Heart sounds: Normal heart sounds. No murmur heard.    No friction rub. No gallop.  Pulmonary:     Effort: Pulmonary effort is normal. No respiratory distress.     Breath sounds: Normal breath sounds. No stridor. No wheezing, rhonchi or rales.  Abdominal:     General: Bowel sounds are normal. There is no distension.     Palpations: Abdomen is soft.     Tenderness: There is no abdominal tenderness.  Musculoskeletal:     Right lower leg: No edema.     Left lower leg: No edema.  Skin:    Findings: No erythema or rash.  Neurological:     Mental Status: She is alert and oriented to person, place, and time.  No results found for any visits on 06/06/22.  Assessment & Plan     Problem List Items Addressed This Visit       Other   Nausea    Nausea has resolved  She reports feeling improved  No further intervention for this problem       Elevated alkaline phosphatase measurement - Primary    Elevated alk phos  'will check vitamin D, PTH and repeat CMP  Follow up with results once available  Patient to follow up in 4-5 months for annual physical       Relevant Orders   PTH, Intact and Calcium   Vitamin D (25 hydroxy)   Comprehensive metabolic panel     Return in about 5 months (around 11/05/2022) for CPE .      I, Eulis Foster, MD, have reviewed all documentation for this  visit.  Portions of this information were initially documented by the CMA and reviewed by me for thoroughness and accuracy.    Eulis Foster, MD  [facility] [phone] (phone) [phone] (fax)  [place]

## 2022-06-06 ENCOUNTER — Encounter: Payer: Self-pay | Admitting: Family Medicine

## 2022-06-06 ENCOUNTER — Ambulatory Visit (INDEPENDENT_AMBULATORY_CARE_PROVIDER_SITE_OTHER): Payer: BC Managed Care – PPO | Admitting: Family Medicine

## 2022-06-06 VITALS — BP 123/81 | HR 84 | Temp 97.5°F | Resp 16 | Wt 140.9 lb

## 2022-06-06 DIAGNOSIS — R110 Nausea: Secondary | ICD-10-CM | POA: Diagnosis not present

## 2022-06-06 DIAGNOSIS — R748 Abnormal levels of other serum enzymes: Secondary | ICD-10-CM | POA: Diagnosis not present

## 2022-06-06 NOTE — Assessment & Plan Note (Signed)
Elevated alk phos  'will check vitamin D, PTH and repeat CMP  Follow up with results once available  Patient to follow up in 4-5 months for annual physical

## 2022-06-06 NOTE — Assessment & Plan Note (Signed)
Nausea has resolved  She reports feeling improved  No further intervention for this problem

## 2022-06-06 NOTE — Patient Instructions (Signed)
Please schedule for your follow up physical in May or June   We will follow up with lab results once available

## 2022-06-07 LAB — COMPREHENSIVE METABOLIC PANEL
ALT: 18 IU/L (ref 0–32)
AST: 15 IU/L (ref 0–40)
Albumin/Globulin Ratio: 1.6 (ref 1.2–2.2)
Albumin: 4.6 g/dL (ref 3.9–4.9)
Alkaline Phosphatase: 100 IU/L (ref 44–121)
BUN/Creatinine Ratio: 21 (ref 12–28)
BUN: 15 mg/dL (ref 8–27)
Bilirubin Total: 0.3 mg/dL (ref 0.0–1.2)
CO2: 25 mmol/L (ref 20–29)
Calcium: 9.5 mg/dL (ref 8.7–10.3)
Chloride: 100 mmol/L (ref 96–106)
Creatinine, Ser: 0.72 mg/dL (ref 0.57–1.00)
Globulin, Total: 2.9 g/dL (ref 1.5–4.5)
Glucose: 98 mg/dL (ref 70–99)
Potassium: 4.7 mmol/L (ref 3.5–5.2)
Sodium: 138 mmol/L (ref 134–144)
Total Protein: 7.5 g/dL (ref 6.0–8.5)
eGFR: 94 mL/min/{1.73_m2} (ref >59)

## 2022-06-07 LAB — VITAMIN D 25 HYDROXY (VIT D DEFICIENCY, FRACTURES): Vit D, 25-Hydroxy: 45.6 ng/mL (ref 30.0–100.0)

## 2022-06-07 LAB — PTH, INTACT AND CALCIUM: PTH: 19 pg/mL (ref 15–65)

## 2022-07-08 ENCOUNTER — Other Ambulatory Visit: Payer: Self-pay | Admitting: Family Medicine

## 2022-07-08 ENCOUNTER — Encounter: Payer: Self-pay | Admitting: Family Medicine

## 2022-07-08 DIAGNOSIS — I10 Essential (primary) hypertension: Secondary | ICD-10-CM

## 2022-07-08 DIAGNOSIS — E7800 Pure hypercholesterolemia, unspecified: Secondary | ICD-10-CM

## 2022-07-08 MED ORDER — LOSARTAN POTASSIUM 50 MG PO TABS: ORAL_TABLET | ORAL | 3 refills | Status: DC

## 2022-07-08 MED ORDER — ATORVASTATIN CALCIUM 40 MG PO TABS: ORAL_TABLET | ORAL | 3 refills | Status: AC

## 2022-07-17 DIAGNOSIS — D2261 Melanocytic nevi of right upper limb, including shoulder: Secondary | ICD-10-CM | POA: Diagnosis not present

## 2022-07-17 DIAGNOSIS — L738 Other specified follicular disorders: Secondary | ICD-10-CM | POA: Diagnosis not present

## 2022-07-17 DIAGNOSIS — Z86018 Personal history of other benign neoplasm: Secondary | ICD-10-CM | POA: Diagnosis not present

## 2022-07-17 DIAGNOSIS — L578 Other skin changes due to chronic exposure to nonionizing radiation: Secondary | ICD-10-CM | POA: Diagnosis not present

## 2022-07-17 DIAGNOSIS — Z872 Personal history of diseases of the skin and subcutaneous tissue: Secondary | ICD-10-CM | POA: Diagnosis not present

## 2022-07-17 DIAGNOSIS — D485 Neoplasm of uncertain behavior of skin: Secondary | ICD-10-CM | POA: Diagnosis not present

## 2022-08-29 ENCOUNTER — Ambulatory Visit: Payer: BC Managed Care – PPO | Admitting: Physician Assistant

## 2022-09-14 DIAGNOSIS — M546 Pain in thoracic spine: Secondary | ICD-10-CM | POA: Diagnosis not present

## 2022-09-14 DIAGNOSIS — J40 Bronchitis, not specified as acute or chronic: Secondary | ICD-10-CM | POA: Diagnosis not present

## 2022-09-14 DIAGNOSIS — S299XXA Unspecified injury of thorax, initial encounter: Secondary | ICD-10-CM | POA: Diagnosis not present

## 2022-09-14 DIAGNOSIS — J329 Chronic sinusitis, unspecified: Secondary | ICD-10-CM | POA: Diagnosis not present

## 2022-09-14 DIAGNOSIS — R0781 Pleurodynia: Secondary | ICD-10-CM | POA: Diagnosis not present

## 2022-10-06 NOTE — Progress Notes (Unsigned)
I,Joseline E Rosas,acting as a scribe for [employer], MD.,have documented all relevant documentation on the behalf of Ronnald Ramp, MD,as directed by  Ronnald Ramp, MD while in the presence of Ronnald Ramp, MD.   Complete physical exam   Patient: Andrea Soto   DOB: Jul 24, 1960   62 y.o. Female  MRN: [id number] Visit Date: 10/07/2022  Today's healthcare provider: Ronnald Ramp, MD   Chief Complaint  Patient presents with   Annual Exam   Subjective    Andrea Soto is a 63 y.o. female who presents today for a complete physical exam.  She reports consuming a general diet. The patient does not participate in regular exercise at present. She generally feels well. She reports sleeping well. She does not have additional problems to discuss today.  HPI    Past Medical History:  Diagnosis Date   Arthritis    shoulders   Breast cyst, left    solitary cyst benign   Depression    Diabetes mellitus, type 2 (HCC)    Family history of adverse reaction to anesthesia    Son - PONV   GERD (gastroesophageal reflux disease)    Hyperlipidemia    Hypertension    Past Surgical History:  Procedure Laterality Date   BREAST BIOPSY  1978   COLONOSCOPY WITH PROPOFOL N/A 10/22/2018   Procedure: COLONOSCOPY WITH PROPOFOL;  Surgeon: Midge Minium, MD;  Location: [facility];  Service: Endoscopy;  Laterality: N/A;  Diabetic - oral meds   ENDOMETRIAL BIOPSY     HYSTEROSCOPY  03/04/2006   POLYPECTOMY N/A 10/22/2018   Procedure: POLYPECTOMY;  Surgeon: Midge Minium, MD;  Location: [facility];  Service: Endoscopy;  Laterality: N/A;   Social History   Socioeconomic History   Marital status: Married    Spouse name: Fayrene Fearing   Number of children: 2   Years of education: 16   Highest education level: Not on file  Occupational History   Occupation: [employer]  Tobacco Use   Smoking status: Never   Smokeless tobacco: Never  Vaping  Use   Vaping Use: Never used  Substance and Sexual Activity   Alcohol use: Yes    Alcohol/week: 1.0 standard drink of alcohol    Types: 1 Glasses of wine per week    Comment: once a month   Drug use: No   Sexual activity: Yes    Birth control/protection: Post-menopausal  Other Topics Concern   Not on file  Social History Narrative   Not on file   Social Determinants of Health   Financial Resource Strain: Not on file  Food Insecurity: Not on file  Transportation Needs: Not on file  Physical Activity: Insufficiently Active (07/03/2017)   Exercise Vital Sign    Days of Exercise per Week: 3 days    Minutes of Exercise per Session: 30 min  Stress: No Stress Concern Present (07/03/2017)   [employer] of Occupational Health - Occupational Stress Questionnaire    Feeling of Stress : Not at all  Social Connections: Socially Integrated (07/03/2017)   Social Connection and Isolation Panel [NHANES]    Frequency of Communication with Friends and Family: More than three times a week    Frequency of Social Gatherings with Friends and Family: More than three times a week    Attends Religious Services: More than 4 times per year    Active Member of [employer] or Organizations: Yes    Attends Banker Meetings: More than 4 times per year  Marital Status: Married  Catering manager Violence: Not At Risk (07/03/2017)   Humiliation, Afraid, Rape, and Kick questionnaire    Fear of Current or Ex-Partner: No    Emotionally Abused: No    Physically Abused: No    Sexually Abused: No   Family Status  Relation Name Status   Mother  Alive   Father  Deceased at age 23   Sister  Alive   Daughter  Alive   Son  Alive   Brother  Alive   PGM  Deceased   Family History  Problem Relation Age of Onset   Hypertension Mother    Allergic rhinitis Mother    Cancer Father        multiple myeloma and bladder cancer.   Heart attack Father    Multiple myeloma Father    Hypertension Sister     Epilepsy Daughter    Seizures Daughter    Allergic rhinitis Son    Breast cancer Paternal Grandmother 62   Bone cancer Paternal Grandmother    Allergies  Allergen Reactions   Ampicillin     Unsure what reaction was   Augmentin [Amoxicillin-Pot Clavulanate] Diarrhea   Adhesive [Tape] Rash    Band-aids    Patient Care Team: Ronnald Ramp, MD as PCP - General (Family Medicine)   Medications: Outpatient Medications Prior to Visit  Medication Sig   aspirin 81 MG tablet Take by mouth.   atorvastatin (LIPITOR) 40 MG tablet Take one tablet (40mg ) by mouth daily   buPROPion (WELLBUTRIN XL) 150 MG 24 hr tablet TAKE 1 TABLET(150 MG) BY MOUTH DAILY   Coenzyme Q10 (COQ10) 200 MG CAPS Take by mouth.   Cyanocobalamin (VITAMIN B-12 PO) Take by mouth.   losartan (COZAAR) 50 MG tablet TAKE 1 TABLET(50 MG) BY MOUTH DAILY   metFORMIN (GLUCOPHAGE) 1000 MG tablet TAKE 1 TABLET BY MOUTH TWICE DAILY WITH A MEAL   sertraline (ZOLOFT) 100 MG tablet TAKE 1 TABLET(100 MG) BY MOUTH DAILY   No facility-administered medications prior to visit.    Review of Systems  HENT:  Positive for postnasal drip.   All other systems reviewed and are negative.     Objective    BP 111/73 (BP Location: Left Arm, Patient Position: Sitting, Cuff Size: Normal)   Pulse 91   Temp 98.6 F (37 C) (Oral)   Resp 16   Ht 5\' 2"  (1.575 m)   Wt 142 lb 1.6 oz (64.5 kg)   BMI 25.99 kg/m     Physical Exam Vitals reviewed.  Constitutional:      General: She is not in acute distress.    Appearance: Normal appearance. She is not ill-appearing, toxic-appearing or diaphoretic.  HENT:     Head: Normocephalic and atraumatic.     Right Ear: Tympanic membrane and external ear normal. There is no impacted cerumen.     Left Ear: Tympanic membrane and external ear normal. There is no impacted cerumen.     Nose: Nose normal.     Mouth/Throat:     Pharynx: Oropharynx is clear.  Eyes:     General: No scleral  icterus.    Extraocular Movements: Extraocular movements intact.     Conjunctiva/sclera: Conjunctivae normal.     Pupils: Pupils are equal, round, and reactive to light.  Cardiovascular:     Rate and Rhythm: Normal rate and regular rhythm.     Pulses: Normal pulses.     Heart sounds: Normal heart sounds. No murmur heard.  No friction rub. No gallop.  Pulmonary:     Effort: Pulmonary effort is normal. No respiratory distress.     Breath sounds: Normal breath sounds. No wheezing, rhonchi or rales.  Abdominal:     General: Bowel sounds are normal. There is no distension.     Palpations: Abdomen is soft. There is no mass.     Tenderness: There is no abdominal tenderness. There is no guarding.  Musculoskeletal:        General: No deformity.     Cervical back: Normal range of motion and neck supple. No rigidity.     Right lower leg: No edema.     Left lower leg: No edema.  Lymphadenopathy:     Cervical: No cervical adenopathy.  Skin:    General: Skin is warm.     Capillary Refill: Capillary refill takes less than 2 seconds.     Findings: No erythema or rash.  Neurological:     General: No focal deficit present.     Mental Status: She is alert and oriented to person, place, and time.     Motor: No weakness.     Gait: Gait normal.  Psychiatric:        Mood and Affect: Mood normal.        Behavior: Behavior normal.       Last depression screening scores    10/07/2022    9:54 AM 03/13/2022   10:02 AM 07/01/2021    9:33 AM  PHQ 2/9 Scores  PHQ - 2 Score 0 0 0  PHQ- 9 Score 1 0 2   Last fall risk screening    10/07/2022    9:53 AM  Fall Risk   Falls in the past year? 1  Number falls in past yr: 1  Comment 2 falls reports that she fell backwards tripped over  Injury with Fall? 0  Risk for fall due to : No Fall Risks  Follow up Falls evaluation completed   Last Audit-C alcohol use screening    10/07/2022    9:55 AM  Alcohol Use Disorder Test (AUDIT)  1. How often do  you have a drink containing alcohol? 1  2. How many drinks containing alcohol do you have on a typical day when you are drinking? 0  3. How often do you have six or more drinks on one occasion? 0  AUDIT-C Score 1   A score of 3 or more in women, and 4 or more in men indicates increased risk for alcohol abuse, EXCEPT if all of the points are from question 1   No results found for any visits on 10/07/22.  Assessment & Plan    Routine Health Maintenance and Physical Exam     Immunization History  Administered Date(s) Administered   Influenza,inj,Quad PF,6+ Mos 03/08/2015, 01/29/2016, 02/09/2017, 02/15/2018, 03/08/2019, 02/22/2020, 02/27/2021, 03/13/2022   PFIZER Comirnaty(Gray Top)Covid-19 Tri-Sucrose Vaccine 08/22/2020   PFIZER(Purple Top)SARS-COV-2 Vaccination 08/17/2019, 09/07/2019   Pneumococcal Polysaccharide-23 04/08/2012   Tdap 07/21/2007, 02/22/2020   Zoster Recombinat (Shingrix) 10/07/2022    Health Maintenance  Topic Date Due   MAMMOGRAM  08/08/2019   PAP SMEAR-Modifier  07/03/2020   OPHTHALMOLOGY EXAM  08/31/2021   COVID-19 Vaccine (4 - 2023-24 season) 01/24/2022   HEMOGLOBIN A1C  09/12/2022   Zoster Vaccines- Shingrix (2 of 2) 12/02/2022   INFLUENZA VACCINE  12/25/2022   Diabetic kidney evaluation - Urine ACR  03/14/2023   FOOT EXAM  03/14/2023   Diabetic kidney evaluation - eGFR  measurement  06/07/2023   COLONOSCOPY (Pts 45-69yrs Insurance coverage will need to be confirmed)  10/22/2023   DTaP/Tdap/Td (3 - Td or Tdap) 02/21/2030   Hepatitis C Screening  Completed   HIV Screening  Completed   HPV VACCINES  Aged Out   Problem List Items Addressed This Visit       Cardiovascular and Mediastinum   Hypertension associated with diabetes (HCC)   Relevant Orders   CBC   T4 AND TSH   Comprehensive metabolic panel     Endocrine   Hyperlipidemia associated with type 2 diabetes mellitus (HCC)   Relevant Orders   Lipid panel   CBC   Diabetes mellitus, type 2  (HCC)   Relevant Orders   CBC   Hemoglobin A1c     Other   Annual physical exam - Primary    Chronic conditions are stable  Patient was counseled on benefits of regular physical activity with goal of 150 minutes of moderate to vigurous intensity 4 days per week  Patient was counseled to consume well balanced diet of fruits, vegetables, limited saturated fats and limited sugary foods and beverages with emphasis on consuming 6-8 glasses of water daily  Screening recommended today: patient gets pap smear with her GYN provider, diabetes eye exam Vaccines recommended today: COVID vaccine, Shingrix vaccine   Patient is UTD on age appropriate screenings       Relevant Orders   CBC   Healthcare maintenance    Shingrix vaccine, dose 1 was administered today, patient tolerated vaccine well       Other Visit Diagnoses     Need for shingles vaccine       Relevant Orders   Varicella-zoster vaccine IM (Completed)        Return in about 1 year (around 10/07/2023) for physical.       The entirety of the information documented in the History of Present Illness, Review of Systems and Physical Exam were personally obtained by me. Portions of this information were initially documented by Hetty Ely, CMA . I, Ronnald Ramp, MD have reviewed the documentation above for thoroughness and accuracy.      Ronnald Ramp, MD  [facility] [phone] (phone) [phone] (fax)  [facility]

## 2022-10-07 ENCOUNTER — Ambulatory Visit (INDEPENDENT_AMBULATORY_CARE_PROVIDER_SITE_OTHER): Payer: BC Managed Care – PPO | Admitting: Family Medicine

## 2022-10-07 ENCOUNTER — Encounter: Payer: Self-pay | Admitting: Family Medicine

## 2022-10-07 VITALS — BP 111/73 | HR 91 | Temp 98.6°F | Resp 16 | Ht 62.0 in | Wt 142.1 lb

## 2022-10-07 DIAGNOSIS — Z0000 Encounter for general adult medical examination without abnormal findings: Secondary | ICD-10-CM | POA: Diagnosis not present

## 2022-10-07 DIAGNOSIS — E1169 Type 2 diabetes mellitus with other specified complication: Secondary | ICD-10-CM | POA: Diagnosis not present

## 2022-10-07 DIAGNOSIS — I152 Hypertension secondary to endocrine disorders: Secondary | ICD-10-CM | POA: Diagnosis not present

## 2022-10-07 DIAGNOSIS — E785 Hyperlipidemia, unspecified: Secondary | ICD-10-CM

## 2022-10-07 DIAGNOSIS — Z23 Encounter for immunization: Secondary | ICD-10-CM

## 2022-10-07 DIAGNOSIS — E119 Type 2 diabetes mellitus without complications: Secondary | ICD-10-CM | POA: Diagnosis not present

## 2022-10-07 DIAGNOSIS — E1159 Type 2 diabetes mellitus with other circulatory complications: Secondary | ICD-10-CM

## 2022-10-07 NOTE — Assessment & Plan Note (Signed)
Shingrix vaccine, dose 1 was administered today, patient tolerated vaccine well

## 2022-10-07 NOTE — Patient Instructions (Addendum)
You will need a diabetes eye exam  Recommended updated COVID vaccine    Health Maintenance After Age 64 After age 89, you are at a higher risk for certain long-term diseases and infections as well as injuries from falls. Falls are a major cause of broken bones and head injuries in people who are older than age 59. Getting regular preventive care can help to keep you healthy and well. Preventive care includes getting regular testing and making lifestyle changes as recommended by your health care provider. Talk with your health care provider about: Which screenings and tests you should have. A screening is a test that checks for a disease when you have no symptoms. A diet and exercise plan that is right for you. What should I know about screenings and tests to prevent falls? Screening and testing are the best ways to find a health problem early. Early diagnosis and treatment give you the best chance of managing medical conditions that are common after age 14. Certain conditions and lifestyle choices may make you more likely to have a fall. Your health care provider may recommend: Regular vision checks. Poor vision and conditions such as cataracts can make you more likely to have a fall. If you wear glasses, make sure to get your prescription updated if your vision changes. Medicine review. Work with your health care provider to regularly review all of the medicines you are taking, including over-the-counter medicines. Ask your health care provider about any side effects that may make you more likely to have a fall. Tell your health care provider if any medicines that you take make you feel dizzy or sleepy. Strength and balance checks. Your health care provider may recommend certain tests to check your strength and balance while standing, walking, or changing positions. Foot health exam. Foot pain and numbness, as well as not wearing proper footwear, can make you more likely to have a fall. Screenings,  including: Osteoporosis screening. Osteoporosis is a condition that causes the bones to get weaker and break more easily. Blood pressure screening. Blood pressure changes and medicines to control blood pressure can make you feel dizzy. Depression screening. You may be more likely to have a fall if you have a fear of falling, feel depressed, or feel unable to do activities that you used to do. Alcohol use screening. Using too much alcohol can affect your balance and may make you more likely to have a fall. Follow these instructions at home: Lifestyle Do not drink alcohol if: Your health care provider tells you not to drink. If you drink alcohol: Limit how much you have to: 0-1 drink a day for women. 0-2 drinks a day for men. Know how much alcohol is in your drink. In the [place] one drink equals one 12 oz bottle of beer (355 mL), one 5 oz glass of wine (148 mL), or one 1 oz glass of hard liquor (44 mL). Do not use any products that contain nicotine or tobacco. These products include cigarettes, chewing tobacco, and vaping devices, such as e-cigarettes. If you need help quitting, ask your health care provider. Activity  Follow a regular exercise program to stay fit. This will help you maintain your balance. Ask your health care provider what types of exercise are appropriate for you. If you need a cane or walker, use it as recommended by your health care provider. Wear supportive shoes that have nonskid soles. Safety  Remove any tripping hazards, such as rugs, cords, and clutter. Install safety equipment  such as grab bars in bathrooms and safety rails on stairs. Keep rooms and walkways well-lit. General instructions Talk with your health care provider about your risks for falling. Tell your health care provider if: You fall. Be sure to tell your health care provider about all falls, even ones that seem minor. You feel dizzy, tiredness (fatigue), or off-balance. Take over-the-counter and  prescription medicines only as told by your health care provider. These include supplements. Eat a healthy diet and maintain a healthy weight. A healthy diet includes low-fat dairy products, low-fat (lean) meats, and fiber from whole grains, beans, and lots of fruits and vegetables. Stay current with your vaccines. Schedule regular health, dental, and eye exams. Summary Having a healthy lifestyle and getting preventive care can help to protect your health and wellness after age 23. Screening and testing are the best way to find a health problem early and help you avoid having a fall. Early diagnosis and treatment give you the best chance for managing medical conditions that are more common for people who are older than age 64. Falls are a major cause of broken bones and head injuries in people who are older than age 68. Take precautions to prevent a fall at home. Work with your health care provider to learn what changes you can make to improve your health and wellness and to prevent falls. This information is not intended to replace advice given to you by your health care provider. Make sure you discuss any questions you have with your health care provider. Document Revised: 10/01/2020 Document Reviewed: 10/01/2020 Elsevier Patient Education  [place].

## 2022-10-07 NOTE — Assessment & Plan Note (Signed)
Chronic conditions are stable  Patient was counseled on benefits of regular physical activity with goal of 150 minutes of moderate to vigurous intensity 4 days per week  Patient was counseled to consume well balanced diet of fruits, vegetables, limited saturated fats and limited sugary foods and beverages with emphasis on consuming 6-8 glasses of water daily  Screening recommended today: patient gets pap smear with her GYN provider, diabetes eye exam Vaccines recommended today: COVID vaccine, Shingrix vaccine   Patient is UTD on age appropriate screenings

## 2022-10-08 LAB — COMPREHENSIVE METABOLIC PANEL
ALT: 19 IU/L (ref 0–32)
AST: 21 IU/L (ref 0–40)
Albumin/Globulin Ratio: 1.8 (ref 1.2–2.2)
Albumin: 4.6 g/dL (ref 3.9–4.9)
Alkaline Phosphatase: 76 IU/L (ref 44–121)
BUN/Creatinine Ratio: 18 (ref 12–28)
BUN: 14 mg/dL (ref 8–27)
Bilirubin Total: 0.2 mg/dL (ref 0.0–1.2)
CO2: 22 mmol/L (ref 20–29)
Calcium: 9 mg/dL (ref 8.7–10.3)
Chloride: 102 mmol/L (ref 96–106)
Creatinine, Ser: 0.76 mg/dL (ref 0.57–1.00)
Globulin, Total: 2.6 g/dL (ref 1.5–4.5)
Glucose: 122 mg/dL — ABNORMAL HIGH (ref 70–99)
Potassium: 4.4 mmol/L (ref 3.5–5.2)
Sodium: 142 mmol/L (ref 134–144)
Total Protein: 7.2 g/dL (ref 6.0–8.5)
eGFR: 87 mL/min/{1.73_m2} (ref >59)

## 2022-10-08 LAB — LIPID PANEL
Chol/HDL Ratio: 3.9 ratio (ref 0.0–4.4)
Cholesterol, Total: 156 mg/dL (ref 100–199)
HDL: 40 mg/dL (ref >39)
LDL Chol Calc (NIH): 79 mg/dL (ref 0–99)
Triglycerides: 225 mg/dL — ABNORMAL HIGH (ref 0–149)
VLDL Cholesterol Cal: 37 mg/dL (ref 5–40)

## 2022-10-08 LAB — CBC
Hematocrit: 40.2 % (ref 34.0–46.6)
Hemoglobin: 12.8 g/dL (ref 11.1–15.9)
MCH: 25.5 pg — ABNORMAL LOW (ref 26.6–33.0)
MCHC: 31.8 g/dL (ref 31.5–35.7)
MCV: 80 fL (ref 79–97)
Platelets: 268 10*3/uL (ref 150–450)
RBC: 5.01 x10E6/uL (ref 3.77–5.28)
RDW: 14.4 % (ref 11.7–15.4)
WBC: 7.4 10*3/uL (ref 3.4–10.8)

## 2022-10-08 LAB — HEMOGLOBIN A1C
Est. average glucose Bld gHb Est-mCnc: 166 mg/dL
Hgb A1c MFr Bld: 7.4 % — ABNORMAL HIGH (ref 4.8–5.6)

## 2022-10-08 LAB — T4 AND TSH
T4, Total: 5.2 ug/dL (ref 4.5–12.0)
TSH: 2.3 u[IU]/mL (ref 0.450–4.500)

## 2022-10-09 ENCOUNTER — Encounter: Payer: Self-pay | Admitting: Family Medicine

## 2022-10-10 ENCOUNTER — Other Ambulatory Visit: Payer: Self-pay | Admitting: Family Medicine

## 2022-10-10 DIAGNOSIS — Z124 Encounter for screening for malignant neoplasm of cervix: Secondary | ICD-10-CM

## 2022-10-14 ENCOUNTER — Encounter: Payer: Self-pay | Admitting: Family Medicine

## 2022-10-14 LAB — HM DIABETES EYE EXAM

## 2022-11-12 ENCOUNTER — Ambulatory Visit: Payer: BC Managed Care – PPO | Admitting: Licensed Practical Nurse

## 2022-11-12 ENCOUNTER — Other Ambulatory Visit (HOSPITAL_COMMUNITY)
Admission: RE | Admit: 2022-11-12 | Discharge: 2022-11-12 | Disposition: A | Discharge status: Home / Self Care | Payer: BC Managed Care – PPO | Source: Ambulatory Visit | Attending: Licensed Practical Nurse | Admitting: Licensed Practical Nurse

## 2022-11-12 VITALS — BP 139/80 | HR 90 | Ht 62.0 in | Wt 141.5 lb

## 2022-11-12 DIAGNOSIS — Z124 Encounter for screening for malignant neoplasm of cervix: Secondary | ICD-10-CM | POA: Diagnosis not present

## 2022-11-12 DIAGNOSIS — Z01419 Encounter for gynecological examination (general) (routine) without abnormal findings: Secondary | ICD-10-CM

## 2022-11-12 NOTE — Progress Notes (Signed)
Gynecology Annual Exam  PCP: Ronnald Ramp, MD  Chief Complaint:  Chief Complaint  Patient presents with   Gynecologic Exam    Pap only     History of Present Illness:Patient is a 62 y.o. G2P2002 presents for annual exam. The patient has no complaints today.   LMP: No LMP recorded. Patient is postmenopausal. Menopause 2006  The patient is sexually active. She denies dyspareunia, hs occasional dryness-use lubricant.  The patient does not perform self breast exams.  There is no notable family history of breast or ovarian cancer in her family.  The patient wears seatbelts: yes.   The patient has regular exercise: no.    The patient reports current symptoms of depression.   Currently on Zoloft  Retired from [employer], cares for her 3 grandchildren and her aging mother Lives with her husband, feels safe Husband is a Patent attorney  PCP; seen recently Wears glasses: eye exam up to date Dentist: goes every 6 months Vit D: takes daily multivitamin Calcium: dairy products  Review of Systems: Review of Systems  Constitutional: Negative.   HENT: Negative.    Eyes: Negative.   Respiratory: Negative.    Cardiovascular: Negative.   Gastrointestinal: Negative.   Genitourinary: Negative.   Musculoskeletal: Negative.   Skin: Negative.   Neurological: Negative.   Endo/Heme/Allergies: Negative.   Psychiatric/Behavioral: Negative.      Past Medical History:  Patient Active Problem List   Diagnosis Date Noted   Annual physical exam 10/07/2022   Healthcare maintenance 10/07/2022   Elevated alkaline phosphatase measurement 06/06/2022   Elevated alkaline phosphatase level 05/06/2022   Nausea 05/06/2022   Encounter for screening for HIV 05/06/2022   Elevated AST (SGOT) 05/06/2022   Nontraumatic tear of right rotator cuff 09/11/2021    Formatting of this note might be different from the original. Added automatically from request for surgery [id number]    Special  screening for malignant neoplasms, colon    Benign neoplasm of cecum    Adaptation reaction 10/14/2014   Depression, major, single episode, mild (HCC) 10/14/2014   Acid reflux 10/14/2014   H/O transient cerebral ischemia 10/14/2014    All risk factors treated.    Hyperlipidemia associated with type 2 diabetes mellitus (HCC) 10/14/2014   Hypertension associated with diabetes (HCC) 10/14/2014   Anisocoria 10/14/2014   Diabetes mellitus, type 2 (HCC) 10/14/2014    urine microalbumin-20 on 05/03/2013  Diabetic foot exam done today is normal with monofilament.     Past Surgical History:  Past Surgical History:  Procedure Laterality Date   BREAST BIOPSY  1978   COLONOSCOPY WITH PROPOFOL N/A 10/22/2018   Procedure: COLONOSCOPY WITH PROPOFOL;  Surgeon: Midge Minium, MD;  Location: [facility];  Service: Endoscopy;  Laterality: N/A;  Diabetic - oral meds   ENDOMETRIAL BIOPSY     HYSTEROSCOPY  03/04/2006   POLYPECTOMY N/A 10/22/2018   Procedure: POLYPECTOMY;  Surgeon: Midge Minium, MD;  Location: [facility];  Service: Endoscopy;  Laterality: N/A;    Gynecologic History:  No LMP recorded. Patient is postmenopausal. Last Pap: Results were: 2019 no abnormalities  Last mammogram: 2019 Results were: BI-RAD I  Obstetric History: [id number]  Family History:  Family History  Problem Relation Age of Onset   Hypertension Mother    Allergic rhinitis Mother    Cancer Father        multiple myeloma and bladder cancer.   Heart attack Father    Multiple myeloma Father    Hypertension  Sister    Epilepsy Daughter    Seizures Daughter    Allergic rhinitis Son    Breast cancer Paternal Grandmother 1   Bone cancer Paternal Grandmother     Social History:  Social History   Socioeconomic History   Marital status: Married    Spouse name: Fayrene Fearing   Number of children: 2   Years of education: 16   Highest education level: Not on file  Occupational History   Occupation: [employer]   Tobacco Use   Smoking status: Never   Smokeless tobacco: Never  Vaping Use   Vaping Use: Never used  Substance and Sexual Activity   Alcohol use: Yes    Alcohol/week: 1.0 standard drink of alcohol    Types: 1 Glasses of wine per week    Comment: once a month   Drug use: No   Sexual activity: Yes    Birth control/protection: Post-menopausal  Other Topics Concern   Not on file  Social History Narrative   Not on file   Social Determinants of Health   Financial Resource Strain: Not on file  Food Insecurity: Not on file  Transportation Needs: Not on file  Physical Activity: Insufficiently Active (07/03/2017)   Exercise Vital Sign    Days of Exercise per Week: 3 days    Minutes of Exercise per Session: 30 min  Stress: No Stress Concern Present (07/03/2017)   [employer] of Occupational Health - Occupational Stress Questionnaire    Feeling of Stress : Not at all  Social Connections: Socially Integrated (07/03/2017)   Social Connection and Isolation Panel [NHANES]    Frequency of Communication with Friends and Family: More than three times a week    Frequency of Social Gatherings with Friends and Family: More than three times a week    Attends Religious Services: More than 4 times per year    Active Member of [employer] or Organizations: Yes    Attends Banker Meetings: More than 4 times per year    Marital Status: Married  Catering manager Violence: Not At Risk (07/03/2017)   Humiliation, Afraid, Rape, and Kick questionnaire    Fear of Current or Ex-Partner: No    Emotionally Abused: No    Physically Abused: No    Sexually Abused: No    Allergies:  Allergies  Allergen Reactions   Ampicillin     Unsure what reaction was   Augmentin [Amoxicillin-Pot Clavulanate] Diarrhea   Adhesive [Tape] Rash    Band-aids    Medications: Prior to Admission medications   Medication Sig Start Date End Date Taking? Authorizing Provider  aspirin 81 MG tablet Take by mouth.   Yes  [provider]  atorvastatin (LIPITOR) 40 MG tablet Take one tablet (40mg ) by mouth daily 07/08/22  Yes Simmons-Robinson, [place], MD  buPROPion (WELLBUTRIN XL) 150 MG 24 hr tablet TAKE 1 TABLET(150 MG) BY MOUTH DAILY 02/20/22  Yes Bosie Clos, MD  Coenzyme Q10 (COQ10) 200 MG CAPS Take by mouth. 04/02/11  Yes [provider]  Cyanocobalamin (VITAMIN B-12 PO) Take by mouth.   Yes [provider]  losartan (COZAAR) 50 MG tablet TAKE 1 TABLET(50 MG) BY MOUTH DAILY 07/08/22  Yes Simmons-Robinson, [place], MD  metFORMIN (GLUCOPHAGE) 1000 MG tablet TAKE 1 TABLET BY MOUTH TWICE DAILY WITH A MEAL 01/01/22  Yes Bosie Clos, MD  sertraline (ZOLOFT) 100 MG tablet TAKE 1 TABLET(100 MG) BY MOUTH DAILY 04/14/22  Yes Alfredia Ferguson, PA-C    Physical Exam Vitals:  Blood pressure 139/80, pulse 90, height 5\' 2"  (1.575 m), weight 141 lb 8 oz (64.2 kg).  General: NAD Skin: fair skin with multiple scattered macules/papules on arms, chest, and legs, sees derm  HEENT: normocephalic, anicteric Thyroid: no enlargement, no palpable nodules Pulmonary: No increased work of breathing, CTAB Cardiovascular: RRR, distal pulses 2+ Breast: Breast symmetrical, no tenderness, no palpable nodules or masses, no skin or nipple retraction present, no nipple discharge.  No axillary or supraclavicular lymphadenopathy. Abdomen: NABS, soft, non-tender, non-distended.  Umbilicus without lesions.  No hepatomegaly, splenomegaly or masses palpable. No evidence of hernia  Genitourinary:  External: Normal external female genitalia.  Normal urethral meatus, normal Bartholin's and Skene's glands.    Vagina: Normal vaginal mucosa consistent with post menopausal status, no evidence of prolapse.  Good tone  Cervix: Grossly normal in appearance, no bleeding  Uterus: Non-enlarged, mobile, normal contour.  No CMT  Adnexa: ovaries non-enlarged, no adnexal masses  Rectal: deferred  Lymphatic: no evidence of  inguinal lymphadenopathy Extremities: no edema, erythema, or tenderness Neurologic: Grossly intact Psychiatric: mood appropriate, affect full       Assessment: 62 y.o. G2P2002 routine annual exam  Plan: Problem List Items Addressed This Visit   None   1) Mammogram - recommend yearly screening mammogram.  Mammogram Was ordered today  2) STI screening  wasoffered and declined  3) ASCCP guidelines and rational discussed.  Patient opts for every 5 years screening interval  4) Osteoporosis  - per USPTF routine screening DEXA at age 63   Consider FDA-approved medical therapies in postmenopausal women and men aged 64 years and older, based on the following: a) A hip or vertebral (clinical or morphometric) fracture b) T-score ? -2.5 at the femoral neck or spine after appropriate evaluation to exclude secondary causes C) Low bone mass (T-score between -1.0 and -2.5 at the femoral neck or spine) and a 10-year probability of a hip fracture ? 3% or a 10-year probability of a major osteoporosis-related fracture ? 20% based on the [place]-adapted WHO algorithm   5) Routine healthcare maintenance including cholesterol, diabetes screening discussed managed by PCP  6) Colonoscopy had in 2020.  Screening recommended starting at age 46 for average risk individuals, age 22 for individuals deemed at increased risk (including African Americans) and recommended to continue until age 38.  For patient age 63-85 individualized approach is recommended.  Gold standard screening is via colonoscopy, Cologuard screening is an acceptable alternative for patient unwilling or unable to undergo colonoscopy.  "Colorectal cancer screening for average?risk adults: 2018 guideline update from the American Cancer Society"CA: A Cancer Journal for Clinicians: Oct 22, 2016   7) No follow-ups on file.   Carie Caddy, CNM   [facility] 11/12/2022, 8:26 AM

## 2022-11-14 LAB — CYTOLOGY - PAP
Comment: NEGATIVE
Diagnosis: NEGATIVE
High risk HPV: NEGATIVE

## 2022-12-11 ENCOUNTER — Telehealth: Payer: Self-pay | Admitting: Family Medicine

## 2022-12-11 DIAGNOSIS — E119 Type 2 diabetes mellitus without complications: Secondary | ICD-10-CM

## 2022-12-11 DIAGNOSIS — F320 Major depressive disorder, single episode, mild: Secondary | ICD-10-CM

## 2022-12-11 MED ORDER — BUPROPION HCL ER (XL) 150 MG PO TB24
150.0000 mg | ORAL_TABLET | Freq: Every day | ORAL | 0 refills | Status: DC
Start: 2022-12-11 — End: 2023-04-20

## 2022-12-11 MED ORDER — METFORMIN HCL 1000 MG PO TABS
ORAL_TABLET | ORAL | 0 refills | Status: DC
Start: 2022-12-11 — End: 2023-03-17

## 2022-12-11 NOTE — Telephone Encounter (Signed)
[facility] pharmacy is requesting prescription refill metFORMIN (GLUCOPHAGE) 1000 MG tablet   Please advise

## 2022-12-11 NOTE — Telephone Encounter (Signed)
[facility] pharmacy is requesting prescription refill buPROPion (WELLBUTRIN XL) 150 MG 24 hr tablet  Please advise

## 2022-12-11 NOTE — Telephone Encounter (Signed)
[facility] pharmacy faxed refill request for the following medications:   metFORMIN (GLUCOPHAGE) 1000 MG table    Please advise

## 2023-01-22 ENCOUNTER — Other Ambulatory Visit: Payer: Self-pay | Admitting: Family Medicine

## 2023-01-22 DIAGNOSIS — Z1231 Encounter for screening mammogram for malignant neoplasm of breast: Secondary | ICD-10-CM

## 2023-02-04 ENCOUNTER — Ambulatory Visit
Admission: RE | Admit: 2023-02-04 | Discharge: 2023-02-04 | Disposition: A | Discharge status: Home / Self Care | Payer: BC Managed Care – PPO | Source: Ambulatory Visit | Attending: Family Medicine | Admitting: Family Medicine

## 2023-02-04 DIAGNOSIS — Z1231 Encounter for screening mammogram for malignant neoplasm of breast: Secondary | ICD-10-CM | POA: Insufficient documentation

## 2023-02-13 ENCOUNTER — Telehealth: Payer: Self-pay

## 2023-02-13 NOTE — Telephone Encounter (Signed)
Copied from CRM [id number]. Topic: Appointment Scheduling - Scheduling Inquiry for Clinic >> Feb 13, 2023 11:04 AM Patsy Lager T wrote: Reason for CRM: patient called requesting her 2nd shingles shot. Please f/u with patient

## 2023-02-13 NOTE — Telephone Encounter (Signed)
Yes that is fine for nurse visit

## 2023-02-18 ENCOUNTER — Ambulatory Visit (INDEPENDENT_AMBULATORY_CARE_PROVIDER_SITE_OTHER): Payer: BC Managed Care – PPO | Admitting: Family Medicine

## 2023-02-18 DIAGNOSIS — Z23 Encounter for immunization: Secondary | ICD-10-CM

## 2023-02-18 NOTE — Progress Notes (Signed)
Patient here for administration of shingles vaccine only.  Not seen or evaluated by provider.

## 2023-03-16 ENCOUNTER — Other Ambulatory Visit: Payer: Self-pay | Admitting: Family Medicine

## 2023-03-16 DIAGNOSIS — E119 Type 2 diabetes mellitus without complications: Secondary | ICD-10-CM

## 2023-03-17 NOTE — Telephone Encounter (Signed)
Requested Prescriptions  Pending Prescriptions Disp Refills   metFORMIN (GLUCOPHAGE) 1000 MG tablet [Pharmacy Med Name: METFORMIN 1000MG  TABLETS] 180 tablet 0    Sig: TAKE 1 TABLET BY MOUTH TWICE DAILY WITH A MEAL     Endocrinology:  Diabetes - Biguanides Failed - 03/16/2023  7:04 AM      Failed - B12 Level in normal range and within 720 days    No results found for: "VITAMINB12"       Failed - CBC within normal limits and completed in the last 12 months    WBC  Date Value Ref Range Status  10/07/2022 7.4 3.4 - 10.8 x10E3/uL Final  11/13/2014 7.2 3.6 - 11.0 K/uL Final   RBC  Date Value Ref Range Status  10/07/2022 5.01 3.77 - 5.28 x10E6/uL Final  11/13/2014 5.02 3.80 - 5.20 MIL/uL Final   Hemoglobin  Date Value Ref Range Status  10/07/2022 12.8 11.1 - 15.9 g/dL Final   Hematocrit  Date Value Ref Range Status  10/07/2022 40.2 34.0 - 46.6 % Final   MCHC  Date Value Ref Range Status  10/07/2022 31.8 31.5 - 35.7 g/dL Final  47/42/5956 38.7 32.0 - 36.0 g/dL Final   [facility]  Date Value Ref Range Status  10/07/2022 25.5 (L) 26.6 - 33.0 pg Final  11/13/2014 27.5 26.0 - 34.0 pg Final   MCV  Date Value Ref Range Status  10/07/2022 80 79 - 97 fL Final   No results found for: "PLTCOUNTKUC", "LABPLAT", "POCPLA" RDW  Date Value Ref Range Status  10/07/2022 14.4 11.7 - 15.4 % Final         Passed - Cr in normal range and within 360 days    Creatinine, Ser  Date Value Ref Range Status  10/07/2022 0.76 0.57 - 1.00 mg/dL Final         Passed - HBA1C is between 0 and 7.9 and within 180 days    Hgb A1c MFr Bld  Date Value Ref Range Status  10/07/2022 7.4 (H) 4.8 - 5.6 % Final    Comment:             Prediabetes: 5.7 - 6.4          Diabetes: >6.4          Glycemic control for adults with diabetes: <7.0          Passed - eGFR in normal range and within 360 days    GFR calc Af Amer  Date Value Ref Range Status  06/25/2020 102 >59 mL/min/1.73 Final    Comment:    **In  accordance with recommendations from the NKF-ASN Task force,**   [facility] is in the process of updating its eGFR calculation to the   2021 CKD-EPI creatinine equation that estimates kidney function   without a race variable.    GFR calc non Af Amer  Date Value Ref Range Status  06/25/2020 89 >59 mL/min/1.73 Final   eGFR  Date Value Ref Range Status  10/07/2022 87 >59 mL/min/1.73 Final         Passed - Valid encounter within last 6 months    Recent Outpatient Visits           3 weeks ago Need for shingles vaccine   [facility] [place], Monico Blitz, DO   5 months ago Annual physical exam   [place] [facility] Simmons-Robinson, [place], MD   9 months ago Elevated alkaline phosphatase measurement   [facility] Simmons-Robinson, [place], MD   10 months ago Elevated alkaline phosphatase level   [place] [facility] [place], [place], MD   1 year ago Type 2 diabetes mellitus without complication, unspecified whether long term insulin use [facility])   [place] [facility] Alfredia Ferguson, [place]

## 2023-04-20 ENCOUNTER — Other Ambulatory Visit: Payer: Self-pay | Admitting: Family Medicine

## 2023-04-20 DIAGNOSIS — F320 Major depressive disorder, single episode, mild: Secondary | ICD-10-CM

## 2023-06-12 ENCOUNTER — Other Ambulatory Visit: Payer: Self-pay | Admitting: Family Medicine

## 2023-06-12 DIAGNOSIS — E119 Type 2 diabetes mellitus without complications: Secondary | ICD-10-CM

## 2023-06-18 DIAGNOSIS — H6501 Acute serous otitis media, right ear: Secondary | ICD-10-CM | POA: Diagnosis not present

## 2023-07-20 DIAGNOSIS — L570 Actinic keratosis: Secondary | ICD-10-CM | POA: Diagnosis not present

## 2023-07-20 DIAGNOSIS — Z872 Personal history of diseases of the skin and subcutaneous tissue: Secondary | ICD-10-CM | POA: Diagnosis not present

## 2023-07-20 DIAGNOSIS — L578 Other skin changes due to chronic exposure to nonionizing radiation: Secondary | ICD-10-CM | POA: Diagnosis not present

## 2023-07-20 DIAGNOSIS — Z86018 Personal history of other benign neoplasm: Secondary | ICD-10-CM | POA: Diagnosis not present

## 2023-07-20 DIAGNOSIS — L738 Other specified follicular disorders: Secondary | ICD-10-CM | POA: Diagnosis not present

## 2023-07-24 ENCOUNTER — Other Ambulatory Visit: Payer: Self-pay | Admitting: Family Medicine

## 2023-07-24 DIAGNOSIS — I10 Essential (primary) hypertension: Secondary | ICD-10-CM

## 2023-07-27 ENCOUNTER — Other Ambulatory Visit: Payer: Self-pay | Admitting: Family Medicine

## 2023-07-27 DIAGNOSIS — I10 Essential (primary) hypertension: Secondary | ICD-10-CM

## 2023-07-28 NOTE — Telephone Encounter (Signed)
 Requested medication (s) are due for refill today:   Yes  Requested medication (s) are on the active medication list:   Yes  Future visit scheduled:   No   Pt is scheduled to see Dr. Julieanne Manson at [facility] on 08/11/2023 per note in chart.   Last ordered: 07/27/2023 #30, 0 refills  Duplicate request received because pt is asking for a 90 day supply.   Returned for Dr. Danella Penton review for refill amount.    Requested Prescriptions  Pending Prescriptions Disp Refills   losartan (COZAAR) 50 MG tablet [Pharmacy Med Name: LOSARTAN 50MG  TABLETS] 90 tablet     Sig: TAKE 1 TABLET(50 MG) BY MOUTH DAILY     Cardiovascular:  Angiotensin Receptor Blockers Failed - 07/28/2023 12:49 PM      Failed - Cr in normal range and within 180 days    Creatinine, Ser  Date Value Ref Range Status  10/07/2022 0.76 0.57 - 1.00 mg/dL Final         Failed - K in normal range and within 180 days    Potassium  Date Value Ref Range Status  10/07/2022 4.4 3.5 - 5.2 mmol/L Final         Passed - Patient is not pregnant      Passed - Last BP in normal range    BP Readings from Last 1 Encounters:  11/12/22 139/80         Passed - Valid encounter within last 6 months    Recent Outpatient Visits           5 months ago Need for shingles vaccine   [facility] [place], Monico Blitz, DO   9 months ago Annual physical exam   [place] [facility] Simmons-Robinson, [place], MD   1 year ago Elevated alkaline phosphatase measurement   [place] [facility] [place], [place], MD   1 year ago Elevated alkaline phosphatase level   [place] [facility] [place], [place], MD   1 year ago Type 2 diabetes mellitus without complication, unspecified whether long term insulin use [facility])   [place] [facility] Alfredia Ferguson, [place]

## 2023-08-11 DIAGNOSIS — E119 Type 2 diabetes mellitus without complications: Secondary | ICD-10-CM | POA: Diagnosis not present

## 2023-08-11 DIAGNOSIS — F320 Major depressive disorder, single episode, mild: Secondary | ICD-10-CM | POA: Diagnosis not present

## 2024-06-01 ENCOUNTER — Ambulatory Visit: Payer: Self-pay

## 2024-06-01 DIAGNOSIS — D123 Benign neoplasm of transverse colon: Secondary | ICD-10-CM | POA: Diagnosis not present

## 2024-06-01 DIAGNOSIS — Z09 Encounter for follow-up examination after completed treatment for conditions other than malignant neoplasm: Secondary | ICD-10-CM | POA: Diagnosis present

## 2024-06-01 DIAGNOSIS — D120 Benign neoplasm of cecum: Secondary | ICD-10-CM | POA: Diagnosis not present

## 2024-06-01 DIAGNOSIS — Z860100 Personal history of colon polyps, unspecified: Secondary | ICD-10-CM | POA: Diagnosis not present
